# Patient Record
Sex: Female | Born: 1960 | Race: Black or African American | Hispanic: No | Smoking: Never smoker
Health system: Southern US, Community
[De-identification: ages and names within clinical notes are randomized; demographics above are authoritative.]

## PROBLEM LIST (undated history)

## (undated) DIAGNOSIS — I10 Essential (primary) hypertension: Secondary | ICD-10-CM

## (undated) DIAGNOSIS — G43909 Migraine, unspecified, not intractable, without status migrainosus: Secondary | ICD-10-CM

## (undated) HISTORY — PX: ABDOMINAL HYSTERECTOMY: SHX81

## (undated) HISTORY — PX: FRACTURE SURGERY: SHX138

## (undated) HISTORY — PX: CHOLECYSTECTOMY: SHX55

---

## 1998-09-18 ENCOUNTER — Ambulatory Visit (HOSPITAL_COMMUNITY): Admission: RE | Admit: 1998-09-18 | Discharge: 1998-09-18 | Payer: Self-pay | Admitting: *Deleted

## 1998-09-18 ENCOUNTER — Encounter: Payer: Self-pay | Admitting: *Deleted

## 1998-10-12 ENCOUNTER — Encounter: Payer: Self-pay | Admitting: Emergency Medicine

## 1998-10-12 ENCOUNTER — Emergency Department (HOSPITAL_COMMUNITY): Admission: EM | Admit: 1998-10-12 | Discharge: 1998-10-12 | Payer: Self-pay | Admitting: Emergency Medicine

## 2014-03-04 DIAGNOSIS — I10 Essential (primary) hypertension: Secondary | ICD-10-CM | POA: Insufficient documentation

## 2014-12-18 DIAGNOSIS — Z8619 Personal history of other infectious and parasitic diseases: Secondary | ICD-10-CM | POA: Insufficient documentation

## 2014-12-18 DIAGNOSIS — Z8711 Personal history of peptic ulcer disease: Secondary | ICD-10-CM | POA: Insufficient documentation

## 2014-12-18 DIAGNOSIS — K219 Gastro-esophageal reflux disease without esophagitis: Secondary | ICD-10-CM | POA: Insufficient documentation

## 2015-02-02 DIAGNOSIS — M7741 Metatarsalgia, right foot: Secondary | ICD-10-CM | POA: Insufficient documentation

## 2016-02-10 DIAGNOSIS — M47812 Spondylosis without myelopathy or radiculopathy, cervical region: Secondary | ICD-10-CM | POA: Insufficient documentation

## 2017-01-16 DIAGNOSIS — M25522 Pain in left elbow: Secondary | ICD-10-CM | POA: Insufficient documentation

## 2017-01-24 ENCOUNTER — Encounter: Payer: Self-pay | Admitting: Physical Therapy

## 2018-02-12 ENCOUNTER — Other Ambulatory Visit: Payer: Self-pay

## 2018-02-12 ENCOUNTER — Encounter: Payer: Self-pay | Admitting: *Deleted

## 2018-02-12 ENCOUNTER — Ambulatory Visit
Admission: EM | Admit: 2018-02-12 | Discharge: 2018-02-12 | Disposition: A | Discharge status: Home / Self Care | Payer: Worker's Compensation | Attending: Family Medicine | Admitting: Family Medicine

## 2018-02-12 ENCOUNTER — Ambulatory Visit (INDEPENDENT_AMBULATORY_CARE_PROVIDER_SITE_OTHER): Payer: Worker's Compensation

## 2018-02-12 DIAGNOSIS — W19XXXA Unspecified fall, initial encounter: Secondary | ICD-10-CM | POA: Diagnosis not present

## 2018-02-12 DIAGNOSIS — M25552 Pain in left hip: Secondary | ICD-10-CM | POA: Diagnosis not present

## 2018-02-12 DIAGNOSIS — R2232 Localized swelling, mass and lump, left upper limb: Secondary | ICD-10-CM | POA: Diagnosis not present

## 2018-02-12 DIAGNOSIS — M25522 Pain in left elbow: Secondary | ICD-10-CM

## 2018-02-12 DIAGNOSIS — M791 Myalgia, unspecified site: Secondary | ICD-10-CM | POA: Diagnosis not present

## 2018-02-12 HISTORY — DX: Essential (primary) hypertension: I10
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
## 2018-02-12 MED ORDER — TRAMADOL HCL 50 MG PO TABS: 50.0000 mg | ORAL_TABLET | Freq: Three times a day (TID) | ORAL | 0 refills | Status: DC | PRN

## 2018-02-12 NOTE — Discharge Instructions (Signed)
Rest.  Pain medication as needed.  Take care  Dr. Patterson Hollenbaugh  
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
## 2018-09-21 ENCOUNTER — Ambulatory Visit
Admission: EM | Admit: 2018-09-21 | Discharge: 2018-09-21 | Disposition: A | Discharge status: Home / Self Care | Payer: Managed Care, Other (non HMO) | Attending: Family Medicine | Admitting: Family Medicine

## 2018-09-21 ENCOUNTER — Encounter: Payer: Self-pay | Admitting: Emergency Medicine

## 2018-09-21 ENCOUNTER — Other Ambulatory Visit: Payer: Self-pay

## 2018-09-21 DIAGNOSIS — S70362A Insect bite (nonvenomous), left thigh, initial encounter: Secondary | ICD-10-CM | POA: Diagnosis not present

## 2018-09-21 DIAGNOSIS — W57XXXA Bitten or stung by nonvenomous insect and other nonvenomous arthropods, initial encounter: Secondary | ICD-10-CM

## 2018-09-21 DIAGNOSIS — S80862A Insect bite (nonvenomous), left lower leg, initial encounter: Secondary | ICD-10-CM

## 2018-09-21 HISTORY — DX: Migraine, unspecified, not intractable, without status migrainosus: G43.909

## 2018-09-21 MED ORDER — DOXYCYCLINE HYCLATE 100 MG PO CAPS: 100.0000 mg | ORAL_CAPSULE | Freq: Two times a day (BID) | ORAL | 0 refills | Status: DC

## 2018-09-21 MED ORDER — MUPIROCIN 2 % EX OINT: TOPICAL_OINTMENT | CUTANEOUS | 0 refills | Status: AC

## 2018-09-21 MED ORDER — TRIAMCINOLONE ACETONIDE 0.1 % EX CREA: 1.0000 "application " | TOPICAL_CREAM | Freq: Two times a day (BID) | CUTANEOUS | 0 refills | Status: DC
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
## 2018-09-21 NOTE — ED Triage Notes (Signed)
Patient in today c/o insect bites on her left thigh x 1 day. Patient states the bite is itchy and painful. 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

## 2018-09-21 NOTE — Discharge Instructions (Addendum)
Use medication as prescribed. Cool compresses. Avoid scratching.   As discussed continue to monitor, and if redness increases in size or ear tenderness then start oral antibiotic.  Reevaluation for any concerns.  Follow up with your primary care physician this week as needed. Return to Urgent care for new or worsening concerns.

## 2019-02-04 DIAGNOSIS — M5442 Lumbago with sciatica, left side: Secondary | ICD-10-CM | POA: Insufficient documentation

## 2019-04-30 DIAGNOSIS — M5412 Radiculopathy, cervical region: Secondary | ICD-10-CM | POA: Insufficient documentation

## 2020-04-14 DIAGNOSIS — IMO0002 Reserved for concepts with insufficient information to code with codable children: Secondary | ICD-10-CM | POA: Insufficient documentation

## 2021-03-04 DIAGNOSIS — M26609 Unspecified temporomandibular joint disorder, unspecified side: Secondary | ICD-10-CM | POA: Insufficient documentation

## 2021-06-07 DIAGNOSIS — M7918 Myalgia, other site: Secondary | ICD-10-CM | POA: Insufficient documentation

## 2021-12-07 ENCOUNTER — Emergency Department: Payer: PRIVATE HEALTH INSURANCE

## 2021-12-07 ENCOUNTER — Encounter: Payer: Self-pay | Admitting: Emergency Medicine

## 2021-12-07 ENCOUNTER — Other Ambulatory Visit: Payer: Self-pay

## 2021-12-07 DIAGNOSIS — I10 Essential (primary) hypertension: Secondary | ICD-10-CM | POA: Diagnosis not present

## 2021-12-07 DIAGNOSIS — S66911A Strain of unspecified muscle, fascia and tendon at wrist and hand level, right hand, initial encounter: Secondary | ICD-10-CM | POA: Insufficient documentation

## 2021-12-07 DIAGNOSIS — Y990 Civilian activity done for income or pay: Secondary | ICD-10-CM | POA: Diagnosis not present

## 2021-12-07 DIAGNOSIS — X500XXA Overexertion from strenuous movement or load, initial encounter: Secondary | ICD-10-CM | POA: Diagnosis not present

## 2021-12-07 DIAGNOSIS — S6991XA Unspecified injury of right wrist, hand and finger(s), initial encounter: Secondary | ICD-10-CM | POA: Diagnosis present

## 2021-12-07 NOTE — ED Triage Notes (Signed)
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

## 2021-12-08 ENCOUNTER — Emergency Department
Admission: EM | Admit: 2021-12-08 | Discharge: 2021-12-08 | Disposition: A | Discharge status: Home / Self Care | Payer: PRIVATE HEALTH INSURANCE | Attending: Emergency Medicine | Admitting: Emergency Medicine

## 2021-12-08 DIAGNOSIS — S66911A Strain of unspecified muscle, fascia and tendon at wrist and hand level, right hand, initial encounter: Secondary | ICD-10-CM

## 2021-12-08 NOTE — ED Provider Notes (Signed)
[facility] Provider Note    Event Date/Time   First MD Initiated Contact with Patient 12/08/21 0008     (approximate)   History   Chief Complaint Hand Injury   HPI  Valerie Hopkins is a 61 y.o. female with past medical history of hypertension and migraines who presents to the ED complaining of hand injury.  Patient reports that hours prior to arrival she was helping to lift a patient when she felt a "pop" in her right hand.  She complains of pain in the ulnar portion of her right hand, near her fourth and fifth metacarpals.  Pain is described as sharp and exacerbated when she tries to move the hand.  Pain became more severe when she was attempting to type on the computer, eventually prompting her to seek care in the ED.  She denies any prior injuries to the right hand, denies injuries to her other extremities today.     Physical Exam   Triage Vital Signs: ED Triage Vitals  Enc Vitals Group     BP 12/07/21 2305 (!) 175/95     Pulse Rate 12/07/21 2305 69     Resp 12/07/21 2305 20     Temp 12/07/21 2305 99 F (37.2 C)     Temp Source 12/07/21 2305 Oral     SpO2 12/07/21 2305 100 %     Weight 12/07/21 2306 174 lb (78.9 kg)     Height 12/07/21 2306 5\' 2"  (1.575 m)     Head Circumference --      Peak Flow --      Pain Score 12/07/21 2306 9     Pain Loc --      Pain Edu? --      Excl. in GC? --     Most recent vital signs: Vitals:   12/07/21 2305  BP: (!) 175/95  Pulse: 69  Resp: 20  Temp: 99 F (37.2 C)  SpO2: 100%    Constitutional: Alert and oriented. Eyes: Conjunctivae are normal. Head: Atraumatic. Nose: No congestion/rhinnorhea. Mouth/Throat: Mucous membranes are moist.  Cardiovascular: Normal rate, regular rhythm. Grossly normal heart sounds.  2+ radial pulses bilaterally. Respiratory: Normal respiratory effort.  No retractions. Lungs CTAB. Gastrointestinal: Soft and nontender. No distention. Musculoskeletal: No lower extremity  tenderness nor edema.  Tenderness to palpation over right fourth and fifth metacarpals with no obvious deformity.  No tenderness to palpation at right wrist, range of motion intact. Neurologic:  Normal speech and language. No gross focal neurologic deficits are appreciated.    ED Results / Procedures / Treatments   Labs (all labs ordered are listed, but only abnormal results are displayed) Labs Reviewed - No data to display  RADIOLOGY X-rays of right hand reviewed by me with no fracture or dislocation.  PROCEDURES:  Critical Care performed: No  Procedures   MEDICATIONS ORDERED IN ED: Medications - No data to display   IMPRESSION / MDM / ASSESSMENT AND PLAN / ED COURSE  I reviewed the triage vital signs and the nursing notes.                              61 y.o. 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  Differential diagnosis includes, but is not limited to, fracture, dislocation, hand strain.  Patient well-appearing and in no  acute distress, no obvious deformities noted to right hand and she is neurovascularly intact distally.  X-rays show no evidence of fracture, dislocation, or other acute injury.  Suspect strain of her right hand and wrist and patient is appropriate for outpatient management.  She was counseled to alternate Tylenol and ibuprofen, follow-up with her PCP.  She was counseled to return to the ED for new worsening symptoms, patient present plan.        FINAL CLINICAL IMPRESSION(S) / ED DIAGNOSES   Final diagnoses:  Strain of right hand, initial encounter     Rx / DC Orders   ED Discharge Orders     None        Note:  This document was prepared using Dragon voice recognition software and may include unintentional dictation errors.   Chesley Noon, MD 12/08/21 [phone]

## 2021-12-10 ENCOUNTER — Encounter: Payer: Self-pay | Admitting: Emergency Medicine

## 2021-12-10 ENCOUNTER — Emergency Department: Payer: No Typology Code available for payment source

## 2021-12-10 ENCOUNTER — Emergency Department
Admission: EM | Admit: 2021-12-10 | Discharge: 2021-12-10 | Disposition: A | Discharge status: Home / Self Care | Payer: No Typology Code available for payment source | Attending: Emergency Medicine | Admitting: Emergency Medicine

## 2021-12-10 ENCOUNTER — Other Ambulatory Visit: Payer: Self-pay

## 2021-12-10 DIAGNOSIS — W19XXXA Unspecified fall, initial encounter: Secondary | ICD-10-CM

## 2021-12-10 DIAGNOSIS — W1789XA Other fall from one level to another, initial encounter: Secondary | ICD-10-CM | POA: Insufficient documentation

## 2021-12-10 DIAGNOSIS — M545 Low back pain, unspecified: Secondary | ICD-10-CM | POA: Insufficient documentation

## 2021-12-10 DIAGNOSIS — I10 Essential (primary) hypertension: Secondary | ICD-10-CM | POA: Insufficient documentation

## 2021-12-10 DIAGNOSIS — M25552 Pain in left hip: Secondary | ICD-10-CM | POA: Diagnosis not present

## 2021-12-10 DIAGNOSIS — R42 Dizziness and giddiness: Secondary | ICD-10-CM | POA: Diagnosis present

## 2021-12-10 LAB — CBC
HCT: 40.1 % (ref 36.0–46.0)
Hemoglobin: 12.2 g/dL (ref 12.0–15.0)
MCH: 28.4 pg (ref 26.0–34.0)
MCHC: 30.4 g/dL (ref 30.0–36.0)
MCV: 93.5 fL (ref 80.0–100.0)
Platelets: 248 10*3/uL (ref 150–400)
RBC: 4.29 MIL/uL (ref 3.87–5.11)
RDW: 12.6 % (ref 11.5–15.5)
WBC: 6 10*3/uL (ref 4.0–10.5)
nRBC: 0 % (ref 0.0–0.2)

## 2021-12-10 LAB — URINALYSIS, ROUTINE W REFLEX MICROSCOPIC
Bilirubin Urine: NEGATIVE
Glucose, UA: NEGATIVE mg/dL
Hgb urine dipstick: NEGATIVE
Ketones, ur: NEGATIVE mg/dL
Leukocytes,Ua: NEGATIVE
Nitrite: NEGATIVE
Protein, ur: NEGATIVE mg/dL
Specific Gravity, Urine: 1.005 (ref 1.005–1.030)
pH: 6 (ref 5.0–8.0)

## 2021-12-10 LAB — BASIC METABOLIC PANEL
Anion gap: 6 (ref 5–15)
BUN: 13 mg/dL (ref 6–20)
CO2: 27 mmol/L (ref 22–32)
Calcium: 9.2 mg/dL (ref 8.9–10.3)
Chloride: 108 mmol/L (ref 98–111)
Creatinine, Ser: 0.67 mg/dL (ref 0.44–1.00)
GFR, Estimated: >60 mL/min (ref >60)
Glucose, Bld: 104 mg/dL — ABNORMAL HIGH (ref 70–99)
Potassium: 4.2 mmol/L (ref 3.5–5.1)
Sodium: 141 mmol/L (ref 135–145)

## 2021-12-10 IMAGING — CR DG LUMBAR SPINE COMPLETE 4+V
1 series · 5 of 5 positions shown · non-contrast
Comparison: None.

CLINICAL DATA: Pt does not remember what happened before she got
out of bed but states that she became dizzy and had a syncopal
episode. Pt c/o pain in her lower back and Left hip. Pain
generalized in lower back which radiates into left hip.Fall w left
sided hip and back pain

EXAM:
LUMBAR SPINE - COMPLETE 4+ VIEW

[Series 1: dg lumbar spine complete 4 +v · 0.14mm/px · 5 of 5 slices shown]
[im 1/5]
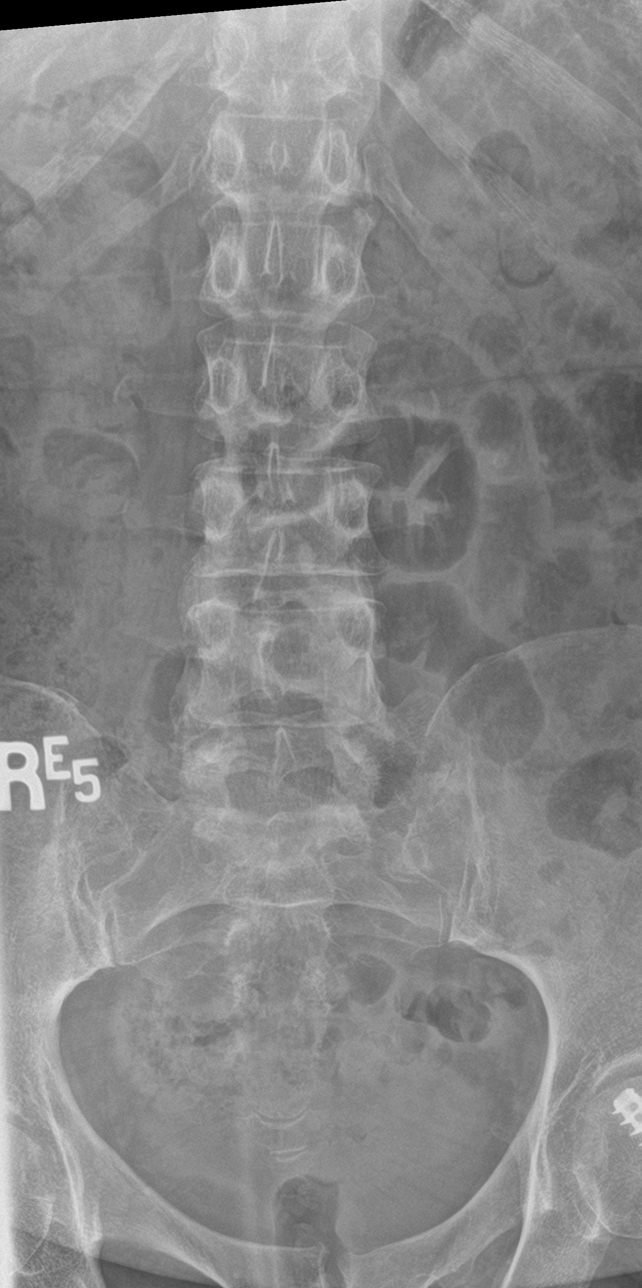
[im 2/5]
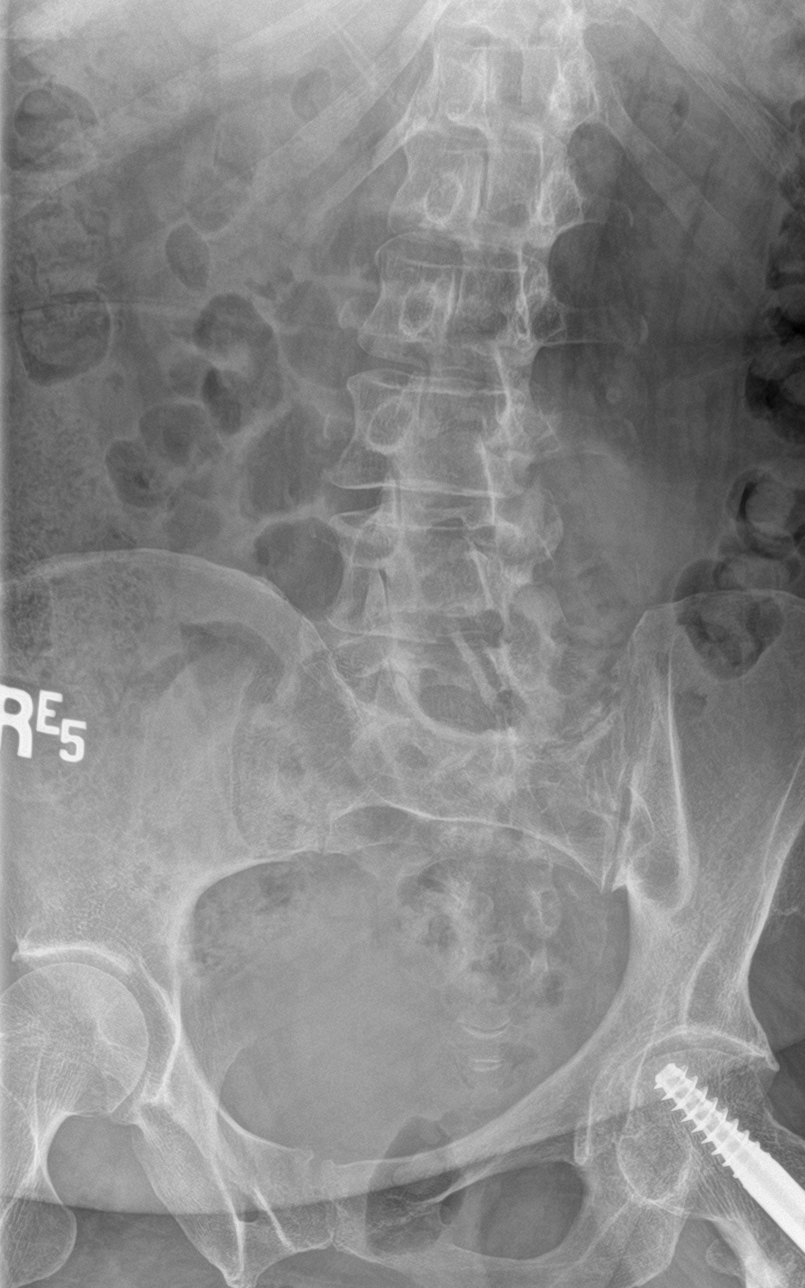
[im 3/5]
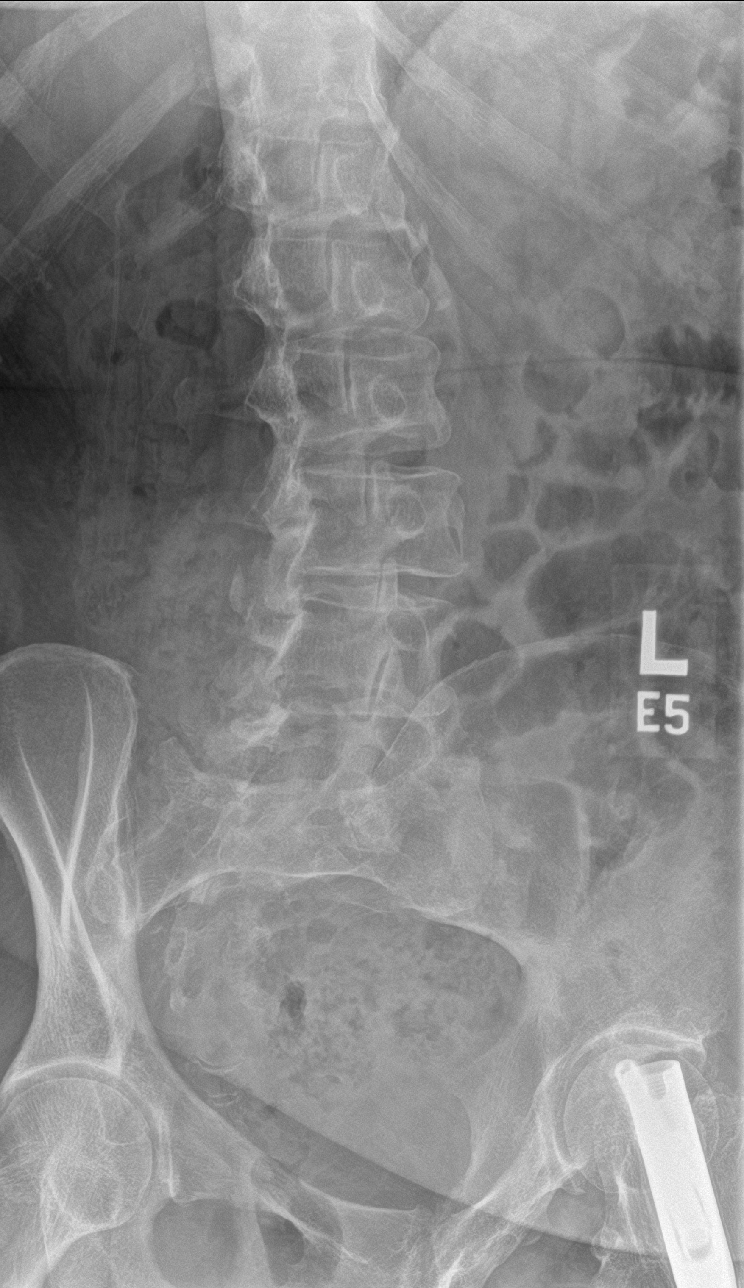
[im 4/5]
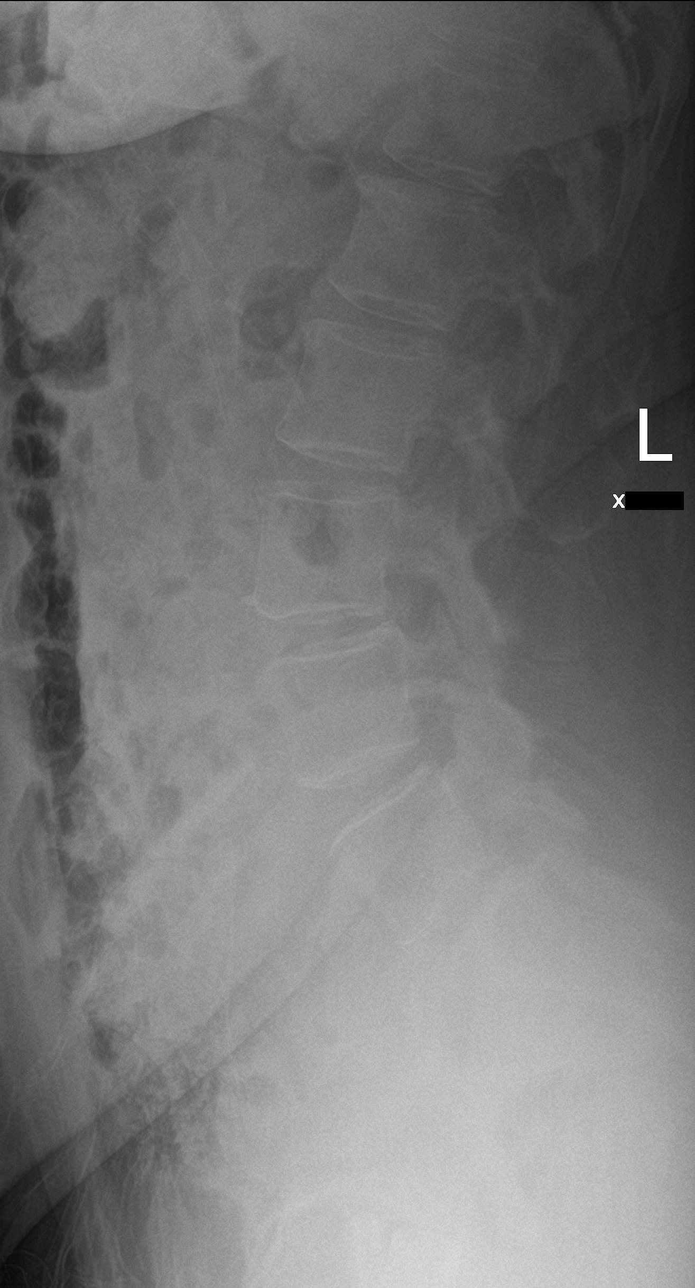
[im 5/5]
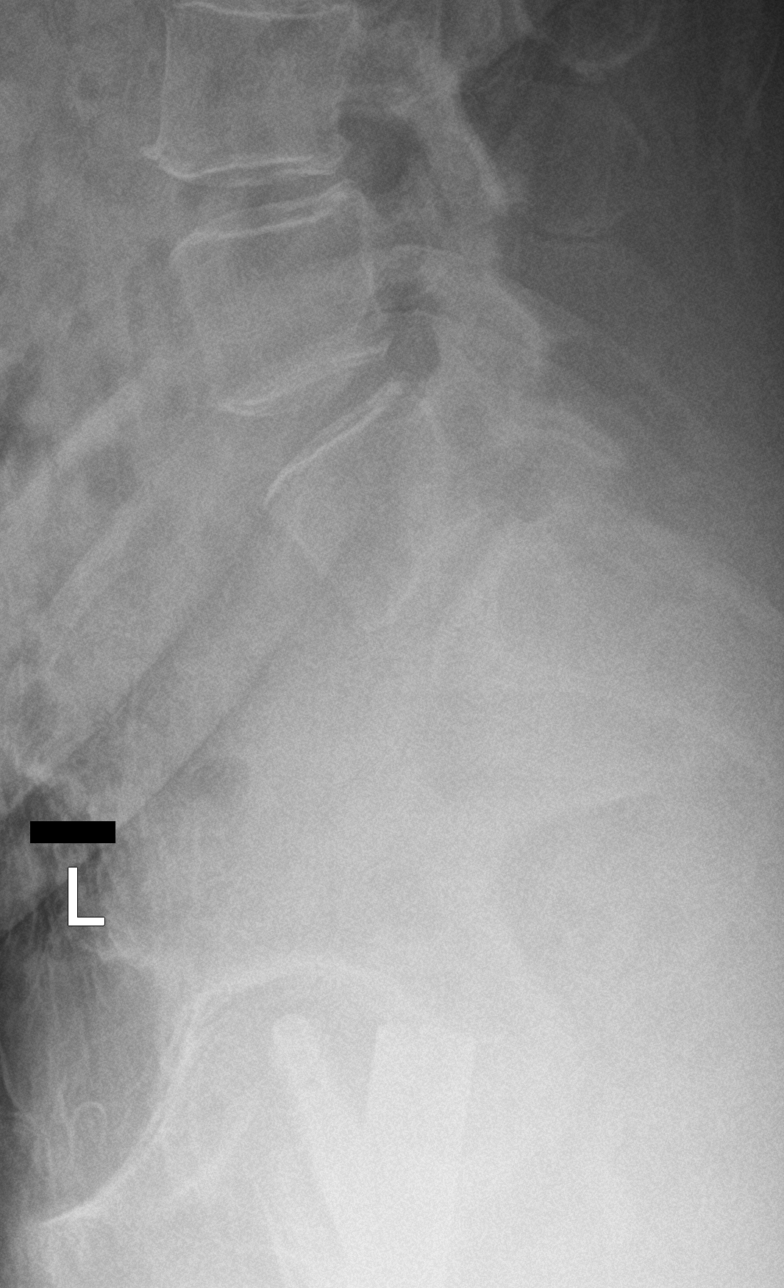

[5 of 5 positions shown; findings below may reference images not displayed]

FINDINGS: Normal alignment of lumbar vertebral bodies. No loss of vertebral
body height or disc height. No pars fracture. No subluxation. LEFT
hip fixation
IMPRESSION: Normal lumbar radiograph.

## 2021-12-10 IMAGING — CR DG HIP (WITH OR WITHOUT PELVIS) 2-3V*L*
1 series · 3 of 3 positions shown · non-contrast
Comparison: [DATE]

CLINICAL DATA: Left hip pain.

EXAM:
DG HIP (WITH OR WITHOUT PELVIS) 2-3V LEFT

[Series 1: dg hip unilat w or w/o pelvis 2-3 views  · non-contrast · 0.14mm/px · 3 of 3 slices shown]
[im 1/3]
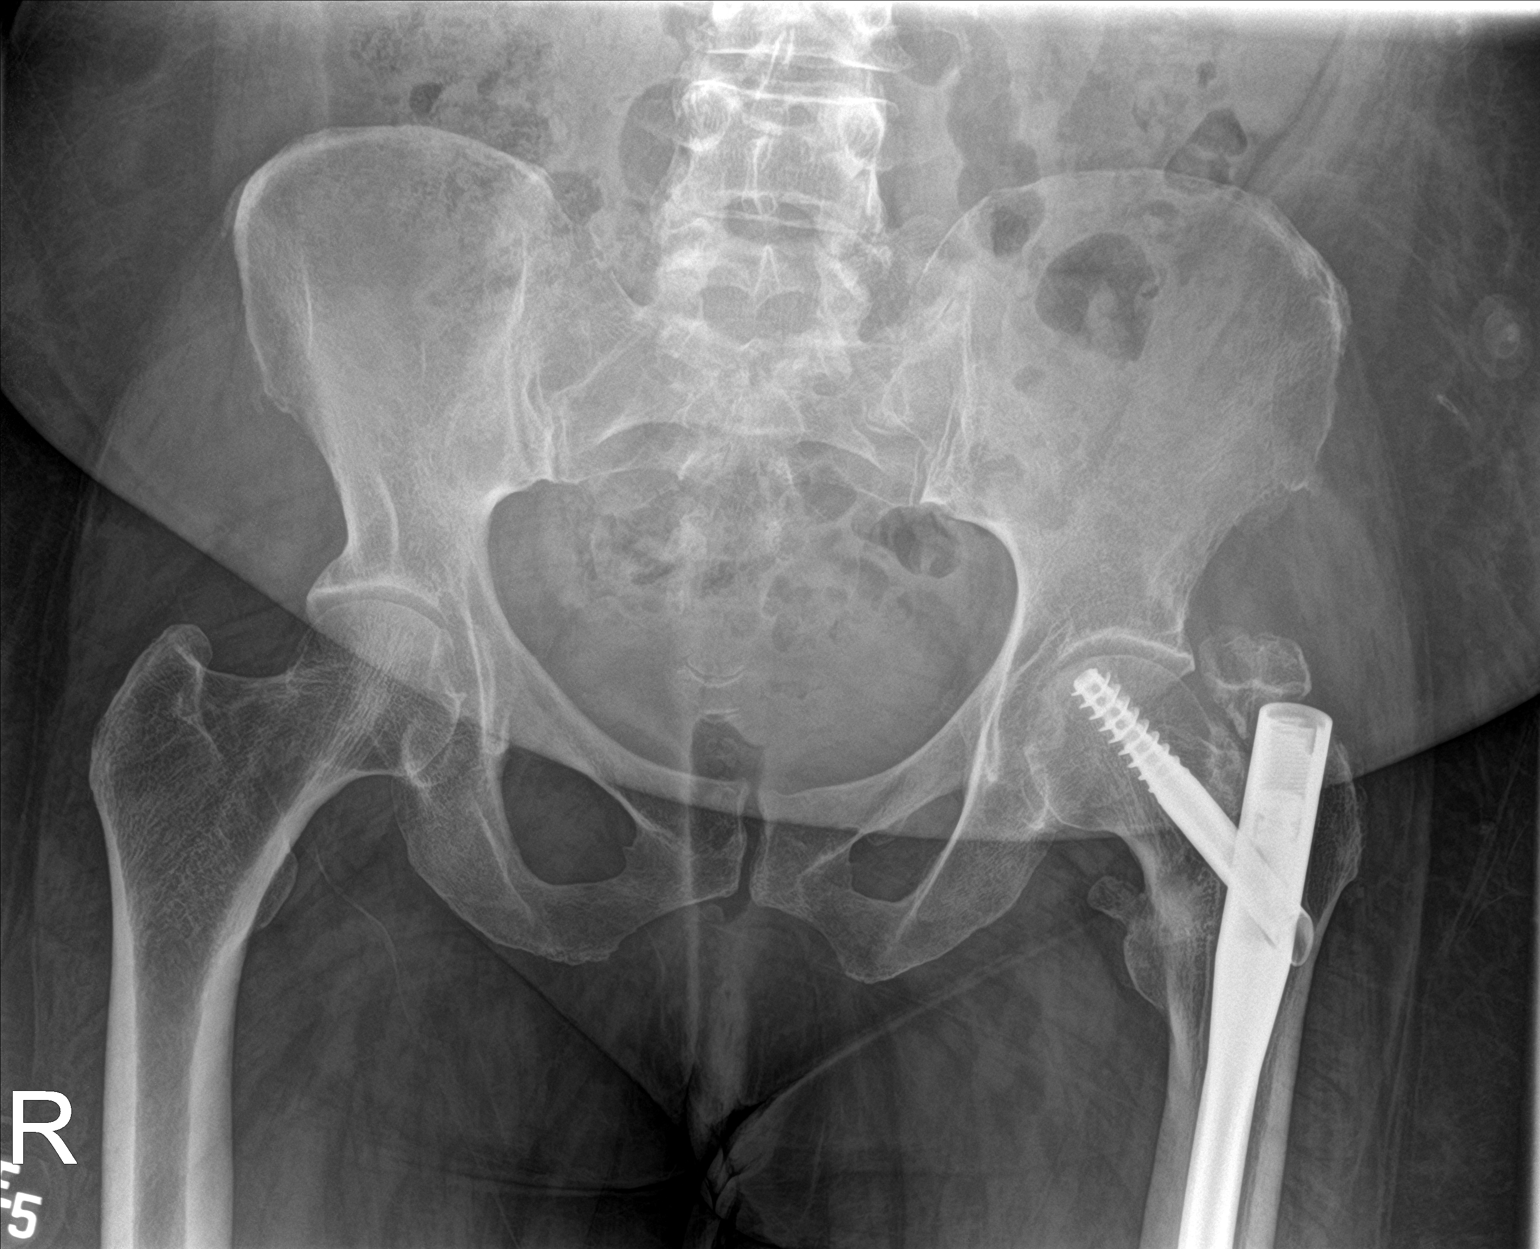
[im 2/3]
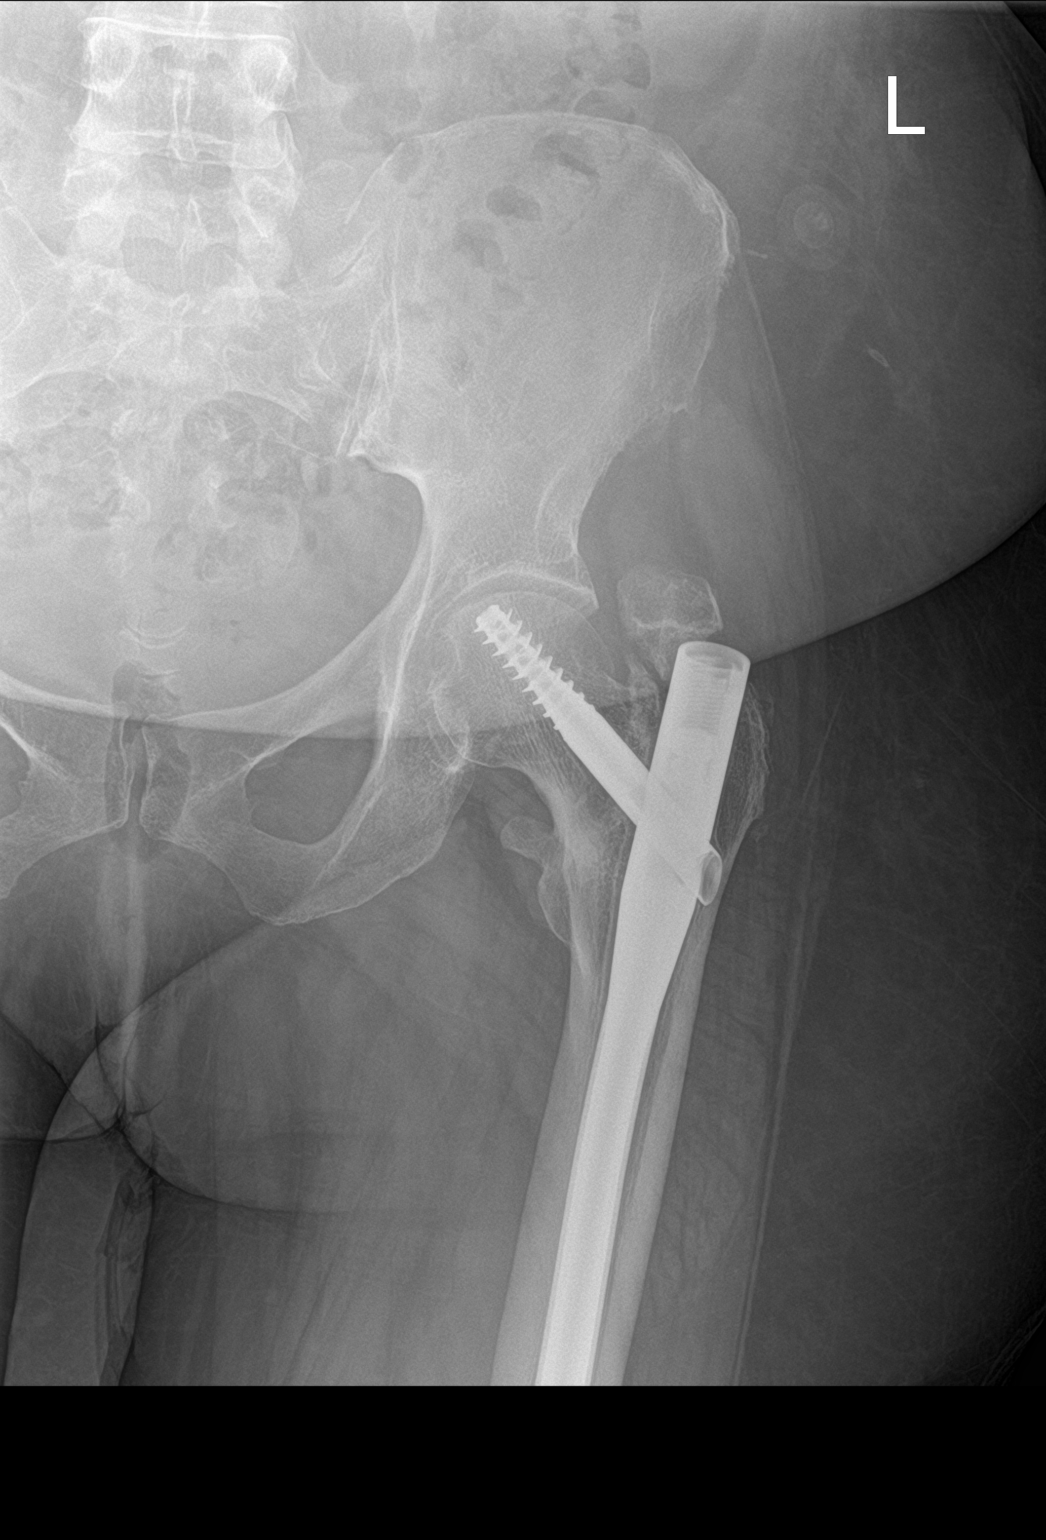
[im 3/3]
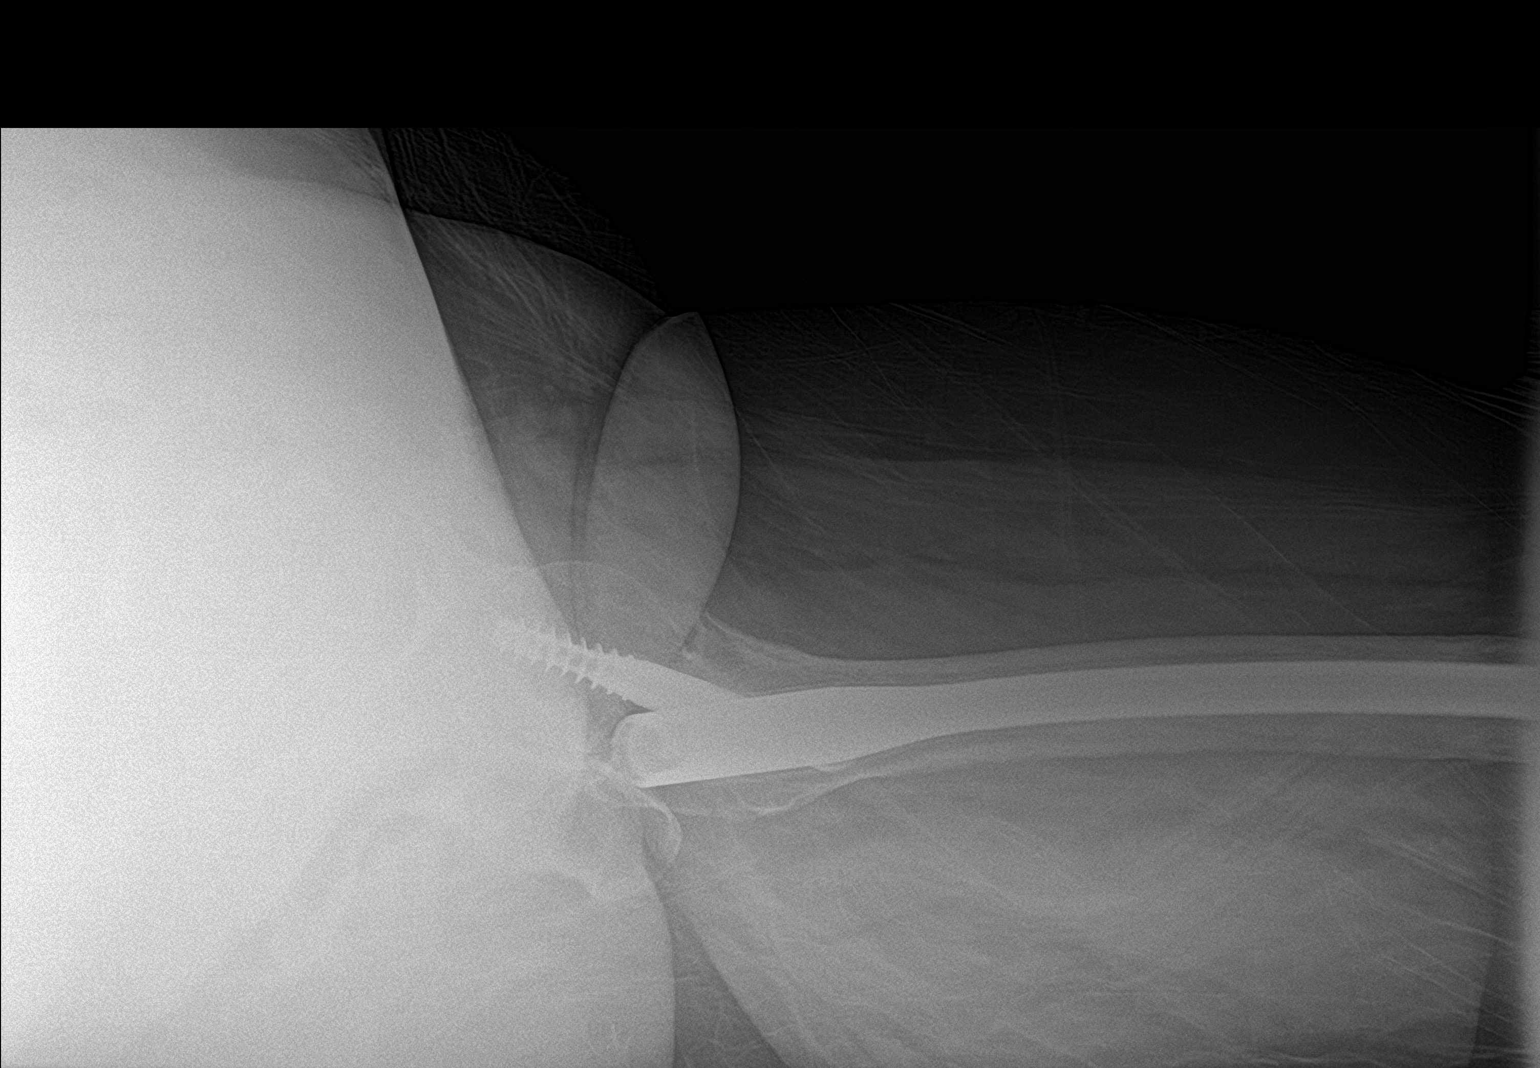

[3 of 3 positions shown; findings below may reference images not displayed]

FINDINGS: Bones are diffusely demineralized. Frontal pelvis shows no acute
fracture. Patient is status post ORIF for proximal left femur
fracture. Appearance is stable in the interval. The entire long
antegrade left femoral nail has not been included on this study.
IMPRESSION: 1. No acute bony abnormality with the limitation that the entire
left femoral IM nail has not been included on the exam. If there is
concern for left femur fracture, dedicated left femur films
recommended.

## 2021-12-10 IMAGING — CR DG FEMUR 2+V*L*
1 series · 4 of 4 positions shown · non-contrast
Comparison: X-ray dated [DATE]

CLINICAL DATA: Recent fall

EXAM:
LEFT FEMUR 2 VIEWS

[Series 1: dg femur min 2 views left · 0.14mm/px · 4 of 4 slices shown]
[im 1/4]
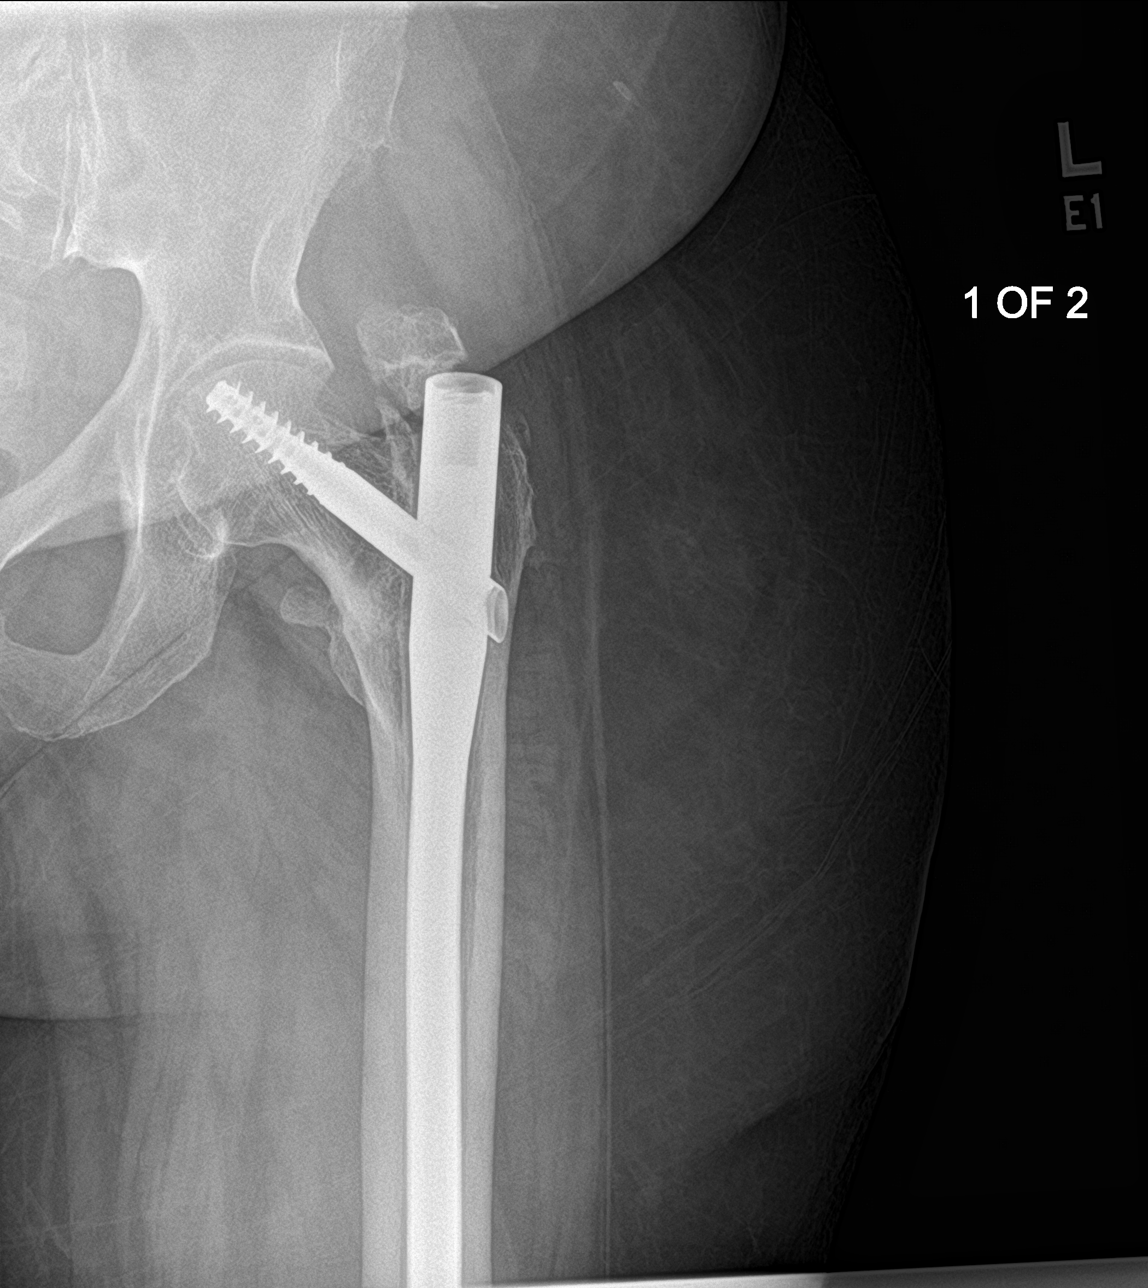
[im 2/4]
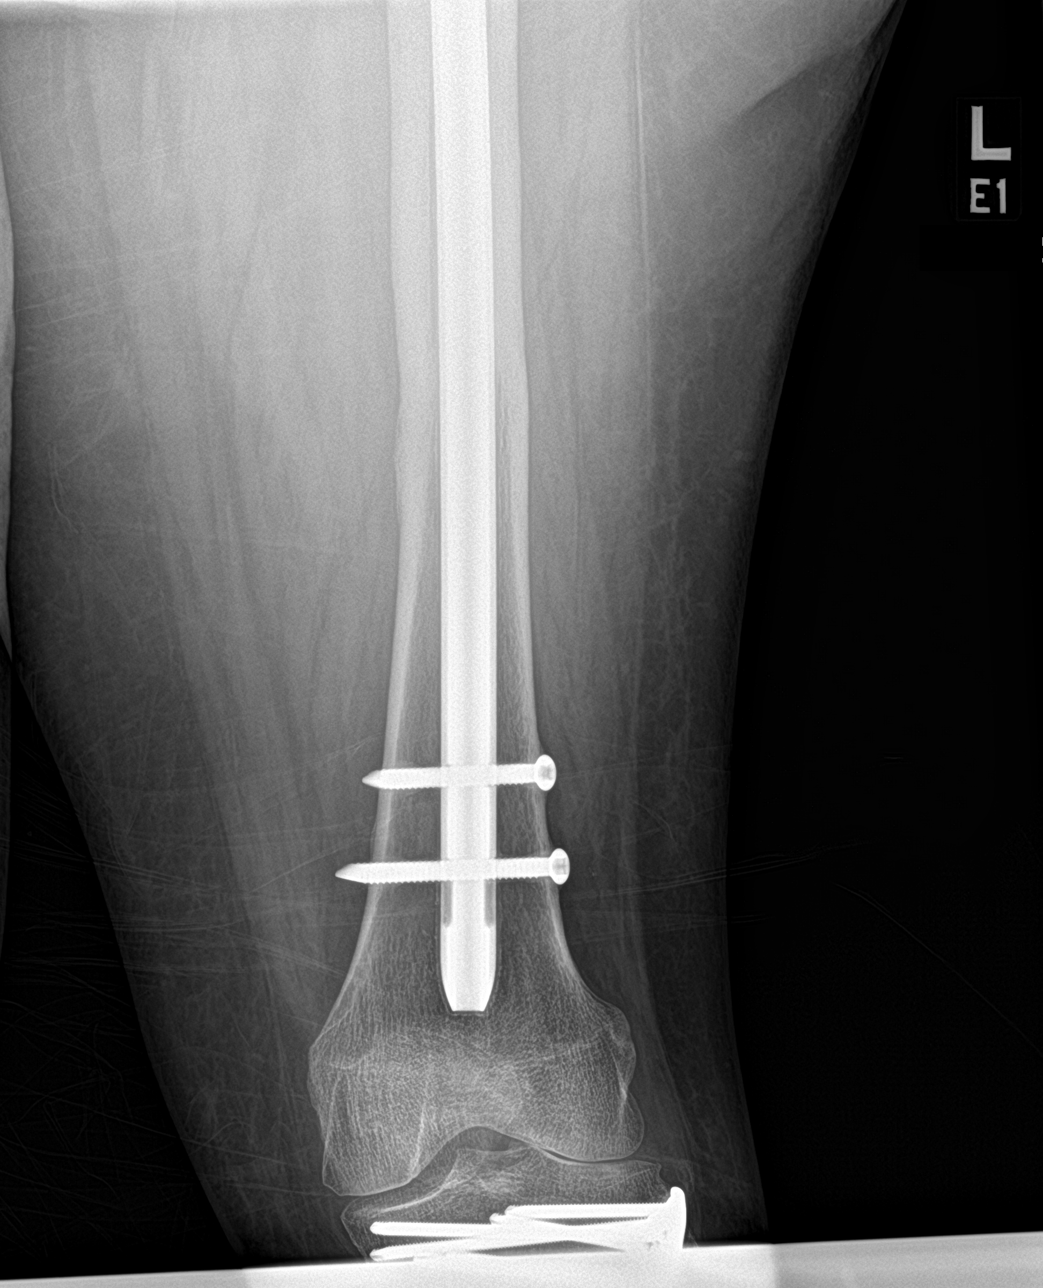
[im 3/4]
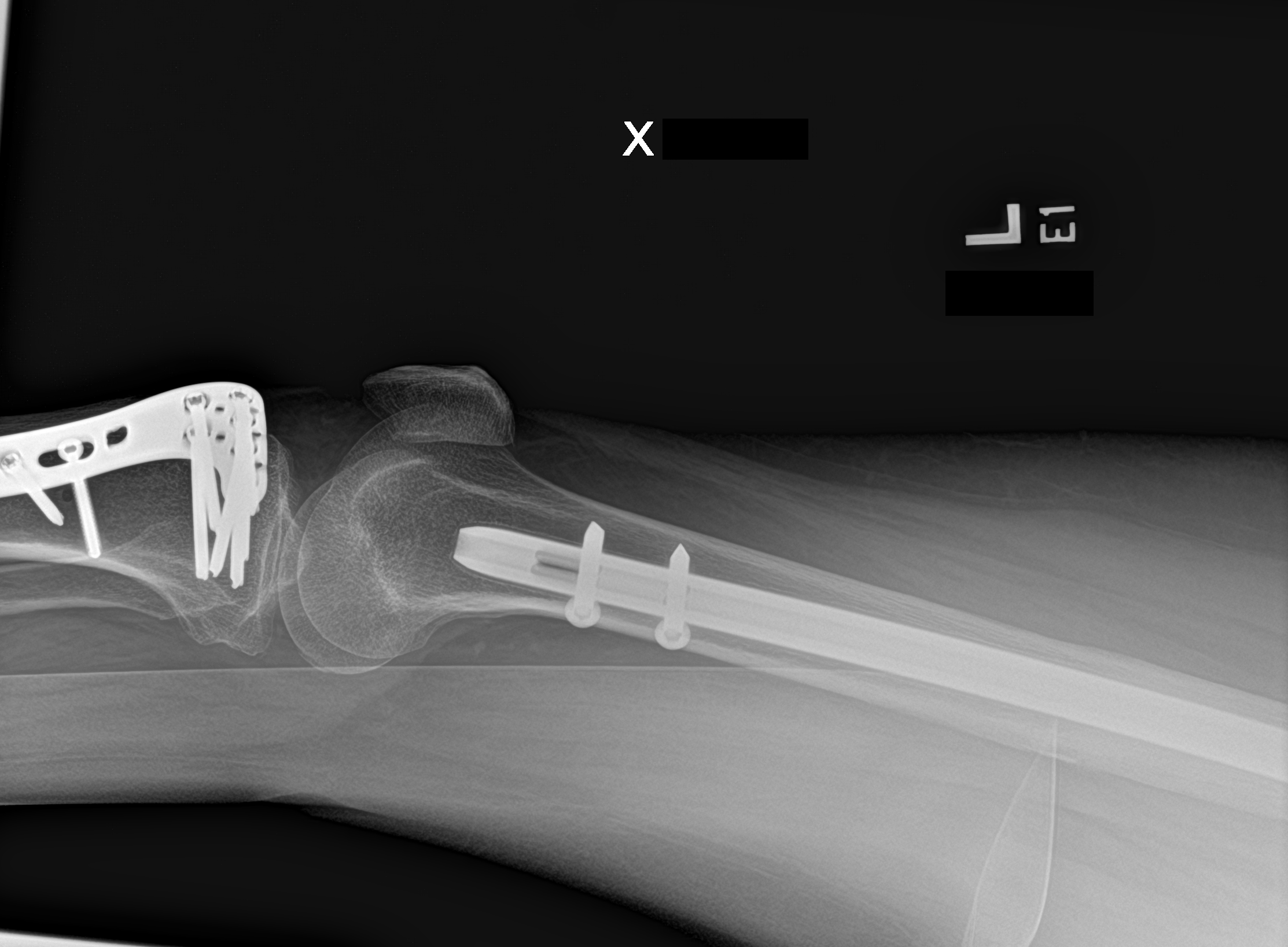
[im 4/4]
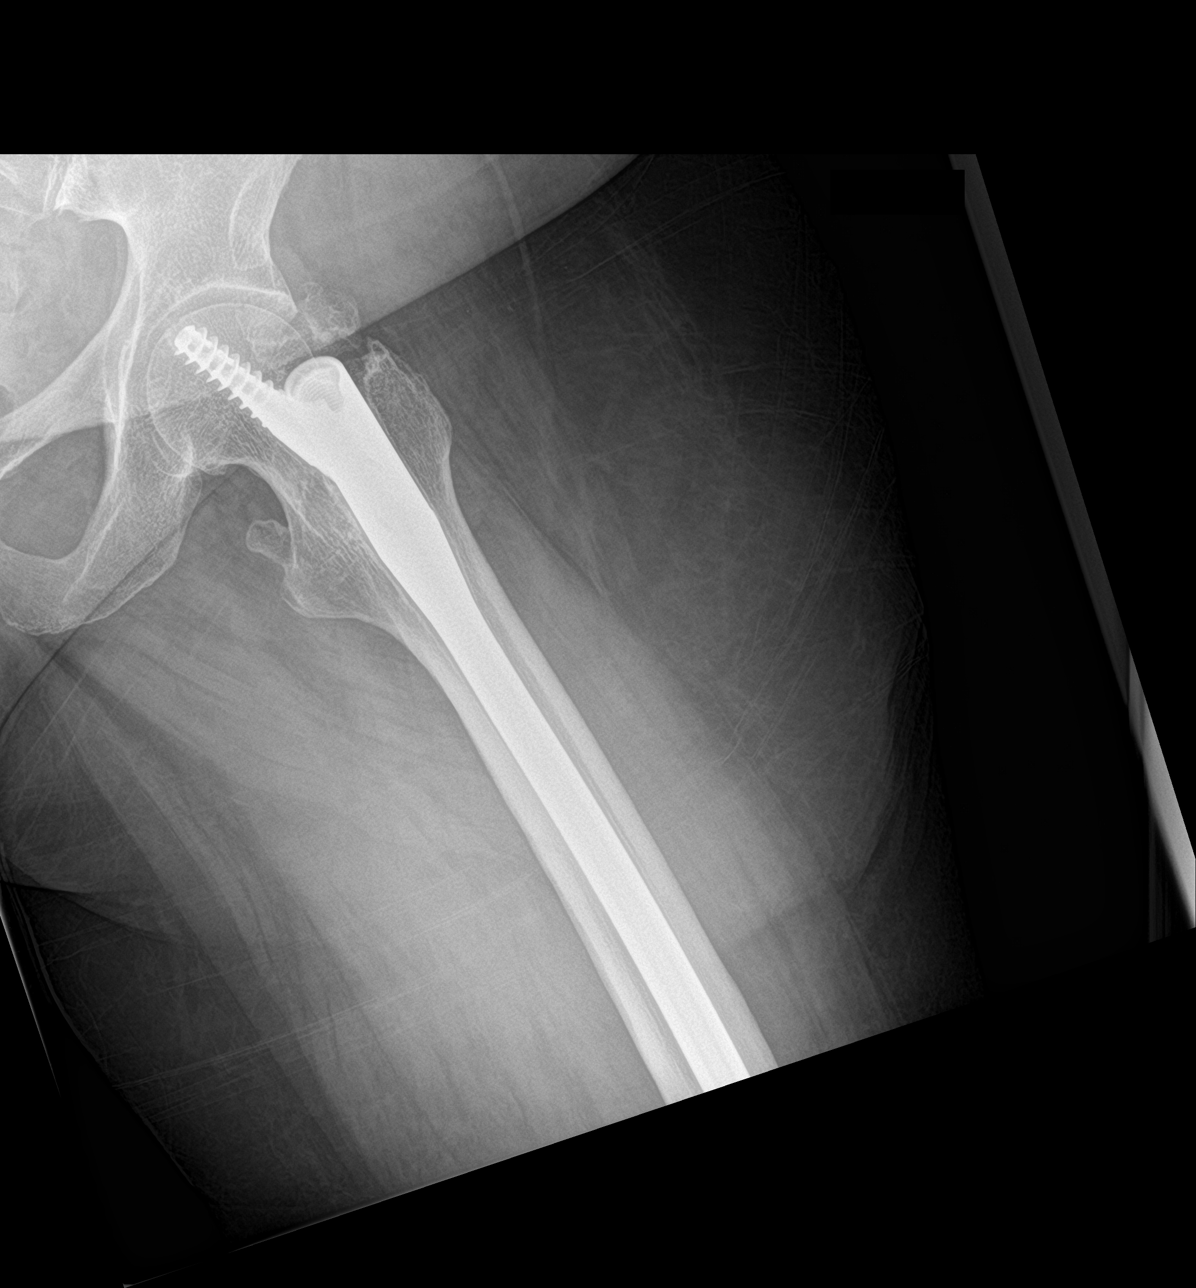

[4 of 4 positions shown; findings below may reference images not displayed]

FINDINGS: Prior intramedullary nail fixation of the left femur. No evidence of
acute fracture or dislocation. Soft tissues are unremarkable.
IMPRESSION: No acute osseous abnormality.

## 2021-12-10 IMAGING — CT CT HEAD W/O CM
4 series · 17 of 47 positions shown, 19 images · non-contrast
Comparison: None.

CLINICAL DATA: Fall with occipital trauma, dizzy



[Series 2: head wo · axial · 0.42mm/px · z∈[-136,-16]mm · 7 of 32 slices shown, 9 images]
[im 4/32  brain]
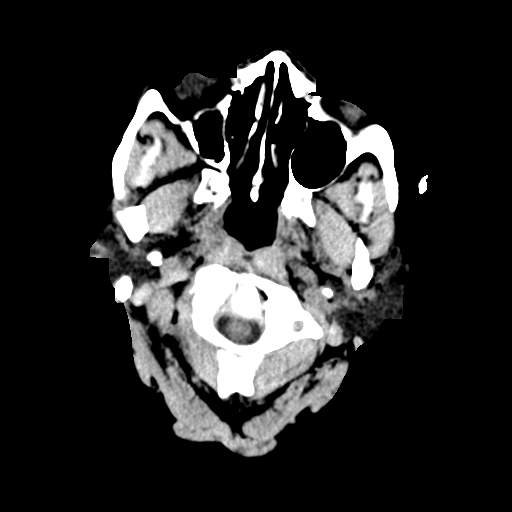
[im 4/32  bone]
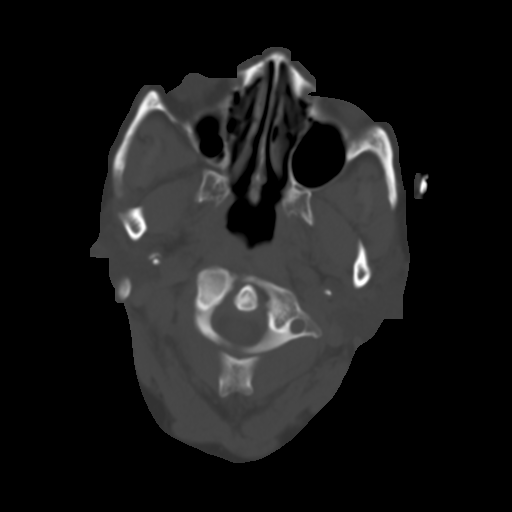
[im 8/32  brain]
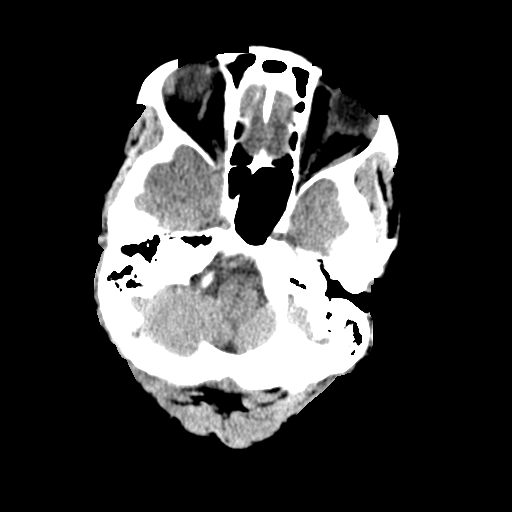
[im 12/32  brain]
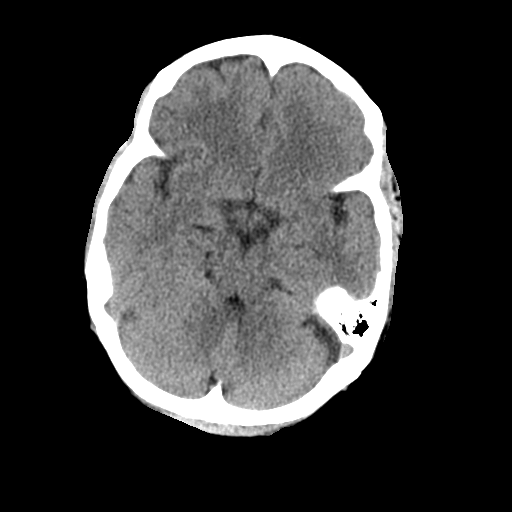
[im 16/32  brain]
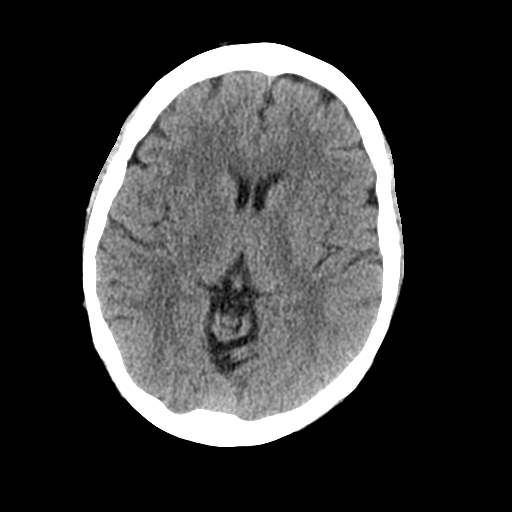
[im 20/32  brain]
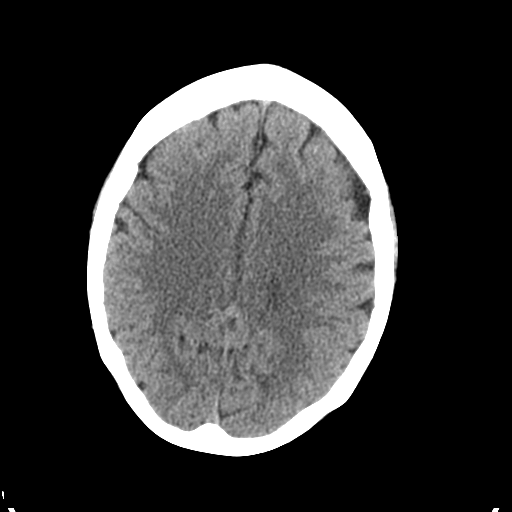
[im 20/32  bone]
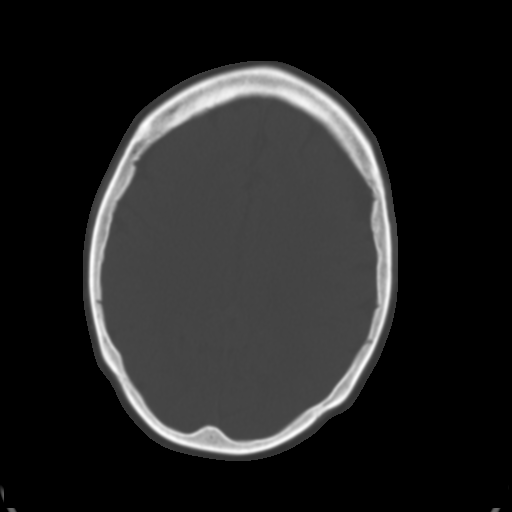
[im 24/32  brain]
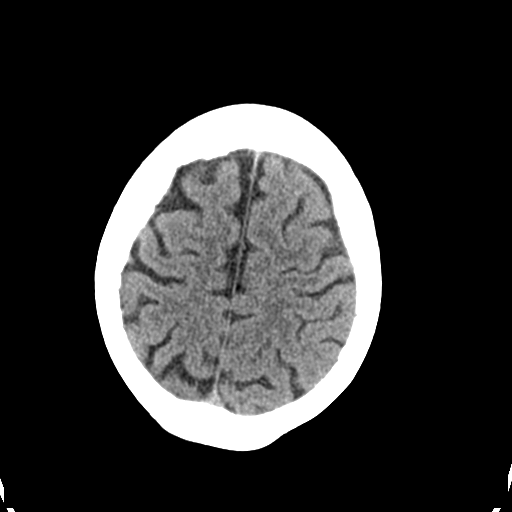
[im 28/32  brain]
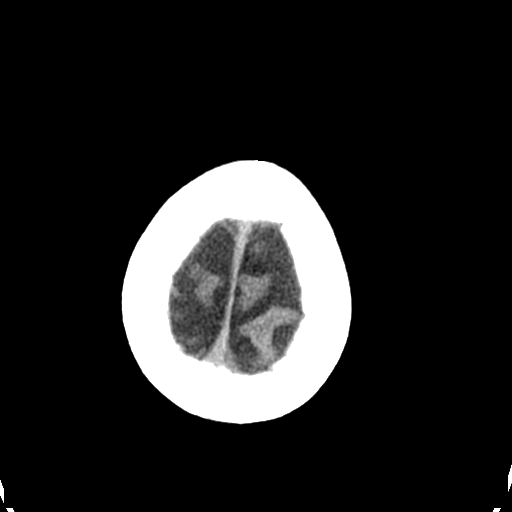

[Series 3: head bone · axial · 0.42mm/px · z∈[-137,-81]mm · 4 of 80 slices shown]
[im 8/80  bone]
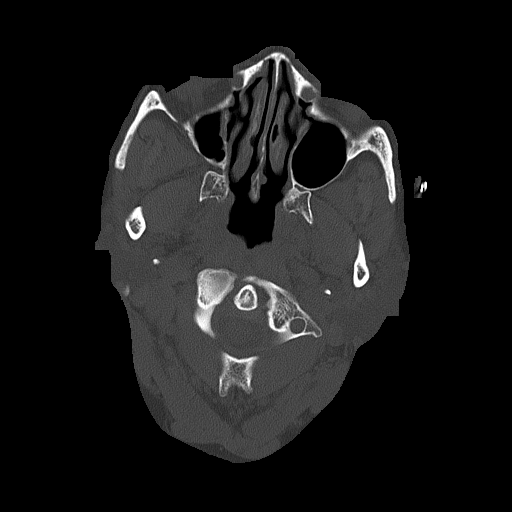
[im 16/80  bone]
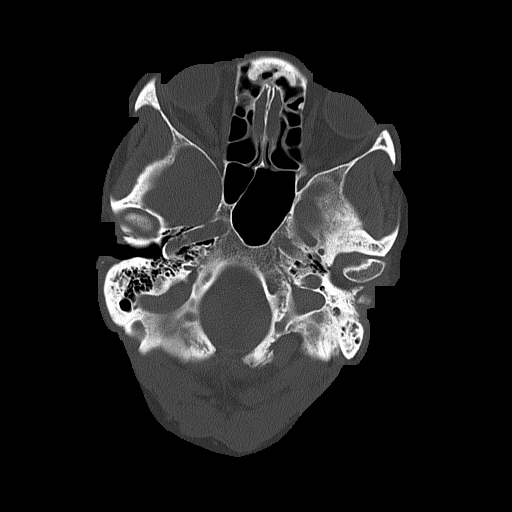
[im 24/80  bone]
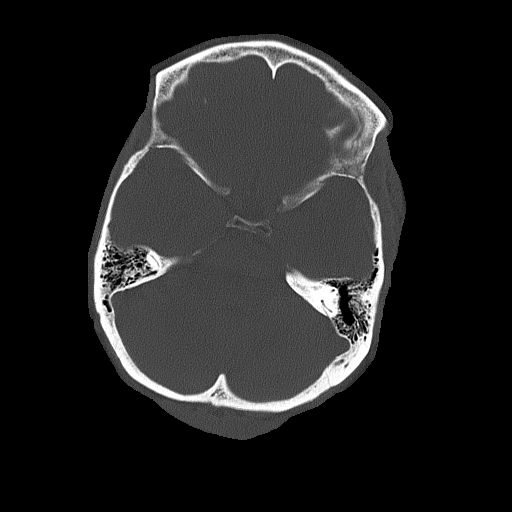
[im 36/80  bone]
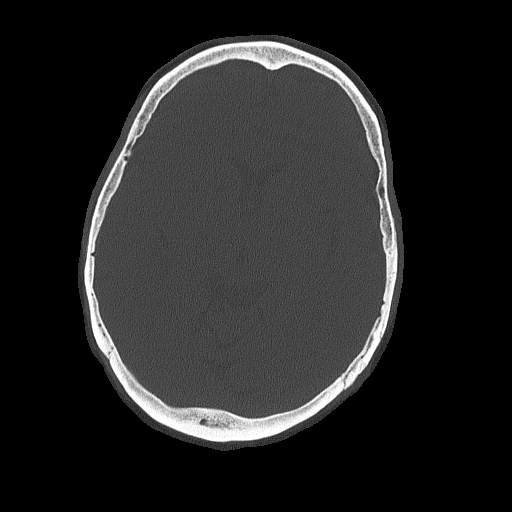

[Series 4: coronal soft tissue · coronal · 0.31mm/px · 3 of 64 slices shown]
[im 22/64  brain]
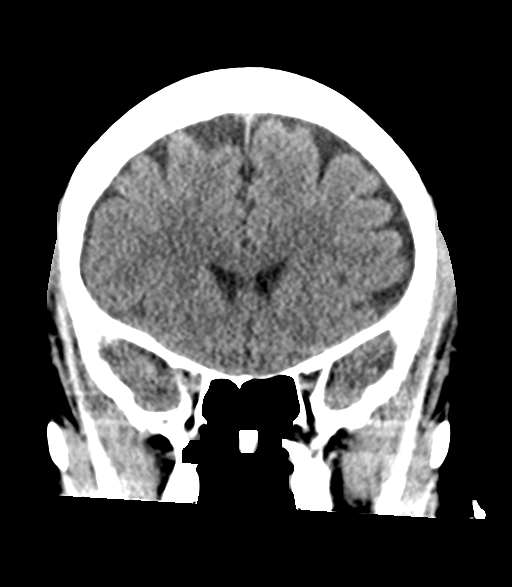
[im 29/64  brain]
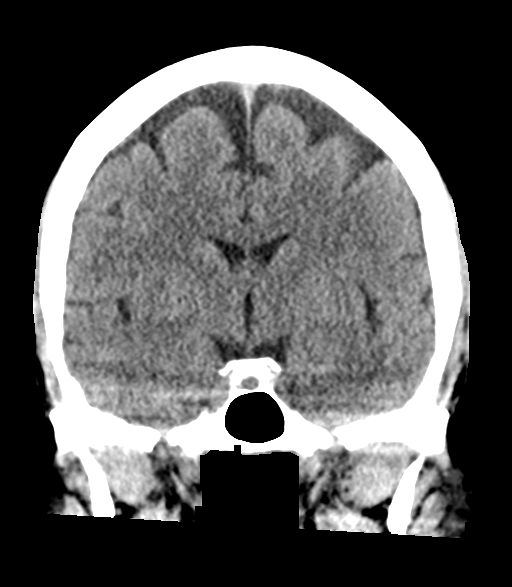
[im 36/64  brain]
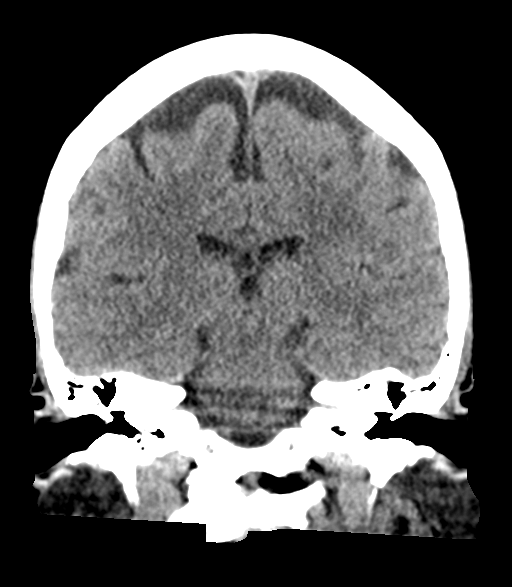

[Series 5: sagittal soft tissue · sagittal · 0.35mm/px · 3 of 52 slices shown]
[im 18/52  brain]
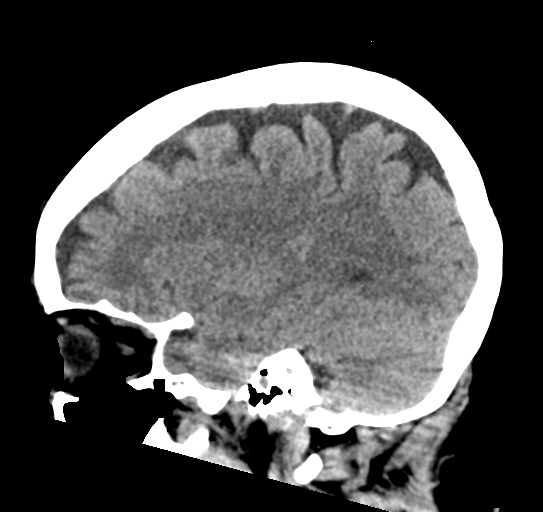
[im 26/52  brain]
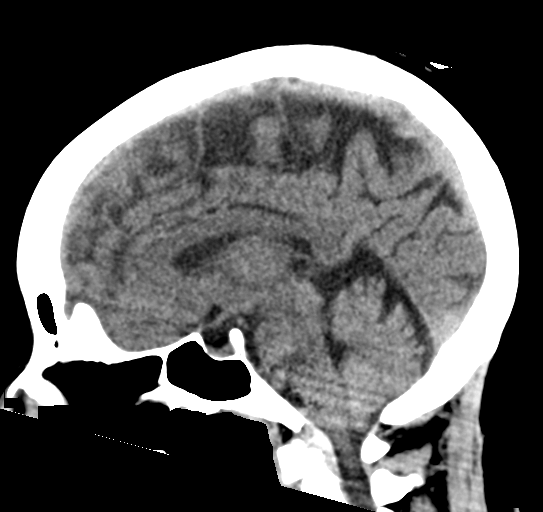
[im 35/52  brain]
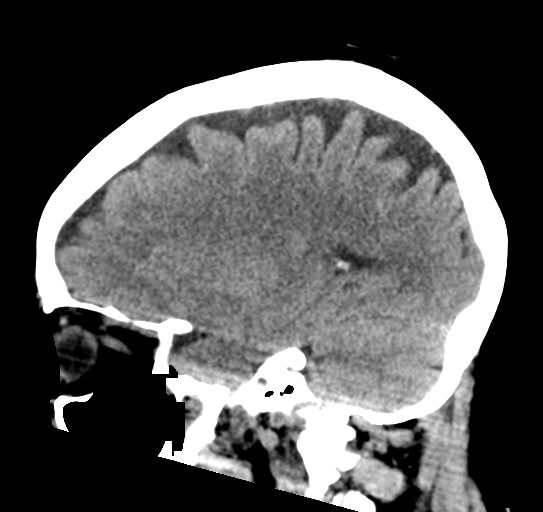

[17 of 47 positions shown; findings below may reference images not displayed]

FINDINGS: Brain: No evidence of acute infarction, hemorrhage, cerebral edema,
mass, mass effect, or midline shift. No hydrocephalus or extra-axial
fluid collection.

Vascular: No hyperdense vessel.

Skull: Normal. Negative for fracture or focal lesion.

Sinuses/Orbits: No acute finding.

Other: The mastoid air cells are well aerated.
IMPRESSION: IMPRESSION
No acute intracranial process.

## 2021-12-10 MED ORDER — LIDOCAINE 5 % EX PTCH
1.0000 | MEDICATED_PATCH | CUTANEOUS | Status: DC
  Administered 2021-12-10: 1 via TRANSDERMAL
  Filled 2021-12-10: qty 1

## 2021-12-10 MED ORDER — GABAPENTIN 400 MG PO CAPS
400.0000 mg | ORAL_CAPSULE | Freq: Once | ORAL | Status: AC
  Administered 2021-12-10: 400 mg via ORAL
  Filled 2021-12-10: qty 1

## 2021-12-10 MED ORDER — MECLIZINE HCL 25 MG PO TABS
25.0000 mg | ORAL_TABLET | Freq: Once | ORAL | Status: AC
  Administered 2021-12-10: 25 mg via ORAL
  Filled 2021-12-10: qty 1

## 2021-12-10 MED ORDER — LIDOCAINE 5 % EX PTCH: 1.0000 | MEDICATED_PATCH | Freq: Two times a day (BID) | CUTANEOUS | 1 refills | Status: DC

## 2021-12-10 MED ORDER — METHOCARBAMOL 500 MG PO TABS
500.0000 mg | ORAL_TABLET | Freq: Once | ORAL | Status: AC
  Administered 2021-12-10: 500 mg via ORAL
  Filled 2021-12-10: qty 1

## 2021-12-10 MED ORDER — ACETAMINOPHEN 500 MG PO TABS
1000.0000 mg | ORAL_TABLET | Freq: Once | ORAL | Status: AC
  Filled 2021-12-10: qty 2

## 2021-12-10 MED ORDER — OXYCODONE HCL 5 MG PO TABS
5.0000 mg | ORAL_TABLET | Freq: Once | ORAL | Status: AC
Start: 2021-12-10 — End: 2021-12-10
  Administered 2021-12-10: 5 mg via ORAL
  Filled 2021-12-10: qty 1

## 2021-12-10 MED ORDER — ACETAMINOPHEN 500 MG PO TABS
ORAL_TABLET | ORAL | Status: AC
  Administered 2021-12-10: 1000 mg via ORAL
  Filled 2021-12-10: qty 1

## 2021-12-10 NOTE — Discharge Instructions (Signed)
Continue your gabapentin and methocarbamol.  Please use lidocaine patches at your site of pain.  Apply 1 patch at a time, leave on for 12 hours, then remove for 12 hours.  12 hours on, 12 hours off.  Do not apply more than 1 patch at a time.  Use Tylenol for pain and fevers.  Up to 1000 mg per dose, up to 4 times per day.  Do not take more than 4000 mg of Tylenol/acetaminophen within 24 hours.Marland Kitchen

## 2021-12-10 NOTE — ED Provider Notes (Signed)
[facility] Provider Note    Event Date/Time   First MD Initiated Contact with Patient 12/10/21 [phone]     (approximate)   History   Fall   HPI  Valerie Hopkins is a 61 y.o. female who presents to the ED for evaluation of Fall   I review outpatient PCP visit from 1/4.  History of obesity, HTN, HLD, vertigo and chronic pain.  Patient presents to the ED, accompanied by a female friend, for evaluation of a mechanical fall that occurred last night.  She reports getting up to void, feeling presyncopal lightheaded dizziness, causing her to stumble to the ground and strike her left hip and buttocks on a carpeted floor.  Denies any syncope and reports her dizziness subsided after she was up onto the ground.  Since that time, she has been ambulatory, but with pain due to left-sided back pain.  Reports chronic history of migraines and headache for which she takes Fioricet, but has not taken this medication yet today.  Reports a typical migrainous headache for her without acute features.   Physical Exam   Triage Vital Signs: ED Triage Vitals  Enc Vitals Group     BP 12/10/21 0828 (!) 183/83     Pulse Rate 12/10/21 0828 (!) 59     Resp 12/10/21 0828 16     Temp 12/10/21 0828 97.9 F (36.6 C)     Temp Source 12/10/21 0828 Oral     SpO2 12/10/21 0828 100 %     Weight 12/10/21 0829 173 lb 15.1 oz (78.9 kg)     Height 12/10/21 0829 5\' 2"  (1.575 m)     Head Circumference --      Peak Flow --      Pain Score 12/10/21 0828 8     Pain Loc --      Pain Edu? --      Excl. in GC? --     Most recent vital signs: Vitals:   12/10/21 1330 12/10/21 1400  BP: 140/88 (!) 156/87  Pulse: 64 (!) 58  Resp: 17 17  Temp:    SpO2: 99% 99%    General: Awake, no distress.  CV:  Good peripheral perfusion.  Resp:  Normal effort.  Abd:  No distention.  MSK:  No deformity noted.  No pain with logrolling bilateral lower extremities.  No deformity to all 4 extremities. Lumbar  paraspinal tenderness to palpation right greater than left.  No midline spinal tenderness. Neuro:  No focal deficits appreciated. Cranial nerves II through XII intact 5/5 strength and sensation in all 4 extremities  Other:     ED Results / Procedures / Treatments   Labs (all labs ordered are listed, but only abnormal results are displayed) Labs Reviewed  URINALYSIS, ROUTINE W REFLEX MICROSCOPIC - Abnormal; Notable for the following components:      Result Value   Color, Urine STRAW (*)    APPearance CLEAR (*)    All other components within normal limits  BASIC METABOLIC PANEL - Abnormal; Notable for the following components:   Glucose, Bld 104 (*)    All other components within normal limits  CBC  CBG MONITORING, ED    EKG  Sinus rhythm, rate of 58 bpm.  Normal axis and intervals.  No evidence of acute ischemia.  Low voltage.  RADIOLOGY Plain films of the lumbar spine, pelvis, left hip and left femur reviewed by me without evidence of fracture or dislocation. CT head reviewed by  me without evidence of acute ICH  Official radiology report(s): DG Lumbar Spine Complete  Result Date: 12/10/2021 CLINICAL DATA:  Pt does not remember what happened before she got out of bed but states that she became dizzy and had a syncopal episode. Pt c/o pain in her lower back and Left hip. Pain generalized in lower back which radiates into left hip.Fall w left sided hip and back pain EXAM: LUMBAR SPINE - COMPLETE 4+ VIEW COMPARISON:  None. FINDINGS: Normal alignment of lumbar vertebral bodies. No loss of vertebral body height or disc height. No pars fracture. No subluxation. LEFT hip fixation IMPRESSION: Normal lumbar radiograph. Electronically Signed   By: Genevive BiStewart  Edmunds M.D.   On: 12/10/2021 11:58   CT HEAD WO CONTRAST (5MM)  Result Date: 12/10/2021 CLINICAL DATA:  Fall with occipital trauma, dizzy EXAM: CT HEAD WITHOUT CONTRAST TECHNIQUE: Contiguous axial images were obtained from the base of  the skull through the vertex without intravenous contrast. RADIATION DOSE REDUCTION: This exam was performed according to the departmental dose-optimization program which includes automated exposure control, adjustment of the mA and/or kV according to patient size and/or use of iterative reconstruction technique. COMPARISON:  None. FINDINGS: Brain: No evidence of acute infarction, hemorrhage, cerebral edema, mass, mass effect, or midline shift. No hydrocephalus or extra-axial fluid collection. Vascular: No hyperdense vessel. Skull: Normal. Negative for fracture or focal lesion. Sinuses/Orbits: No acute finding. Other: The mastoid air cells are well aerated. IMPRESSION: IMPRESSION No acute intracranial process. Electronically Signed   By: Wiliam KeAlison  Vasan M.D.   On: 12/10/2021 11:44   DG HIP UNILAT WITH PELVIS 2-3 VIEWS LEFT  Result Date: 12/10/2021 CLINICAL DATA:  Left hip pain. EXAM: DG HIP (WITH OR WITHOUT PELVIS) 2-3V LEFT COMPARISON:  02/12/2018 FINDINGS: Bones are diffusely demineralized. Frontal pelvis shows no acute fracture. Patient is status post ORIF for proximal left femur fracture. Appearance is stable in the interval. The entire long antegrade left femoral nail has not been included on this study. IMPRESSION: 1. No acute bony abnormality with the limitation that the entire left femoral IM nail has not been included on the exam. If there is concern for left femur fracture, dedicated left femur films recommended. Electronically Signed   By: Kennith CenterEric  Mansell M.D.   On: 12/10/2021 11:53   DG FEMUR MIN 2 VIEWS LEFT  Result Date: 12/10/2021 CLINICAL DATA:  Recent fall EXAM: LEFT FEMUR 2 VIEWS COMPARISON:  X-ray dated February 12, 2017 FINDINGS: Prior intramedullary nail fixation of the left femur. No evidence of acute fracture or dislocation. Soft tissues are unremarkable. IMPRESSION: No acute osseous abnormality. Electronically Signed   By: Allegra LaiLeah  Strickland M.D.   On: 12/10/2021 14:45    PROCEDURES and  INTERVENTIONS:  .1-3 Lead EKG Interpretation Performed by: Delton PrairieSmith, Madeleine Fenn, MD Authorized by: Delton PrairieSmith, Mckay Brandt, MD     Interpretation: normal     ECG rate:  60   ECG rate assessment: normal     Rhythm: sinus rhythm     Ectopy: none     Conduction: normal    Medications  lidocaine (LIDODERM) 5 % 1 patch (1 patch Transdermal Patch Applied 12/10/21 1504)  acetaminophen (TYLENOL) tablet 1,000 mg (1,000 mg Oral Given 12/10/21 1213)  meclizine (ANTIVERT) tablet 25 mg (25 mg Oral Given 12/10/21 1213)  methocarbamol (ROBAXIN) tablet 500 mg (500 mg Oral Given 12/10/21 1556)  gabapentin (NEURONTIN) capsule 400 mg (400 mg Oral Given 12/10/21 1556)  oxyCODONE (Oxy IR/ROXICODONE) immediate release tablet 5 mg (5 mg Oral Given  12/10/21 1504)     IMPRESSION / MDM / ASSESSMENT AND PLAN / ED COURSE  I reviewed the triage vital signs and the nursing notes.  61 year old female presents to the ED with left-sided back pain after a fall last night, without evidence of acute traumatic pathology, and suitable for outpatient management.  She looks clinically well to me.  No signs of neurologic vascular deficits.  No gross deformity or signs of significant trauma.  Has vague tenderness paraspinally to her lumbar back.  Plain films of her back, left hip and pelvis without evidence of fracture or dislocation.  Scan the head without evidence of ICH or CVA.  Blood work is benign with normal CBC, metabolic panel and urine without infectious features.  We will provide her with her typical chronic pain medications as well as single dose of oxycodone.  Discharged with lidocaine patches and we discussed return precautions for the ED.  Clinical Course as of 12/10/21 1557  Fri Dec 10, 2021  1452 Reassessed.  Requesting chronic analgesic medications.  We discussed management at home and work-up. [DS]    Clinical Course User Index [DS] Delton Prairie, MD     FINAL CLINICAL IMPRESSION(S) / ED DIAGNOSES   Final diagnoses:  Fall      Rx / DC Orders   ED Discharge Orders          Ordered    lidocaine (LIDODERM) 5 %  Every 12 hours        12/10/21 1555             Note:  This document was prepared using Dragon voice recognition software and may include unintentional dictation errors.   Delton Prairie, MD 12/10/21 [phone]

## 2021-12-10 NOTE — ED Notes (Signed)
Pt here after a fall last night. Pt does not remember what happened before she got out of bed but states that she became dizzy and had a syncopal episode. Pt c/o pain in her back and head.

## 2021-12-10 NOTE — ED Triage Notes (Addendum)
C/O headache last night and back pain.  This morning got up out of bed, felt as if she was urinating on self.  Patient states "I got up too fast and fell.  My head was spinning and I should have sat on the bed for a minute"".  Fell to carpeted floor.  C/O headache and back pain.

## 2021-12-16 ENCOUNTER — Emergency Department: Payer: No Typology Code available for payment source

## 2021-12-16 ENCOUNTER — Other Ambulatory Visit: Payer: Self-pay

## 2021-12-16 ENCOUNTER — Emergency Department
Admission: EM | Admit: 2021-12-16 | Discharge: 2021-12-16 | Disposition: A | Discharge status: Home / Self Care | Payer: No Typology Code available for payment source | Attending: Emergency Medicine | Admitting: Emergency Medicine

## 2021-12-16 DIAGNOSIS — I779 Disorder of arteries and arterioles, unspecified: Secondary | ICD-10-CM | POA: Diagnosis not present

## 2021-12-16 DIAGNOSIS — M5030 Other cervical disc degeneration, unspecified cervical region: Secondary | ICD-10-CM | POA: Insufficient documentation

## 2021-12-16 DIAGNOSIS — M5137 Other intervertebral disc degeneration, lumbosacral region: Secondary | ICD-10-CM | POA: Diagnosis not present

## 2021-12-16 DIAGNOSIS — R202 Paresthesia of skin: Secondary | ICD-10-CM

## 2021-12-16 DIAGNOSIS — I7789 Other specified disorders of arteries and arterioles: Secondary | ICD-10-CM

## 2021-12-16 DIAGNOSIS — M5440 Lumbago with sciatica, unspecified side: Secondary | ICD-10-CM

## 2021-12-16 DIAGNOSIS — M5126 Other intervertebral disc displacement, lumbar region: Secondary | ICD-10-CM | POA: Diagnosis not present

## 2021-12-16 DIAGNOSIS — I10 Essential (primary) hypertension: Secondary | ICD-10-CM | POA: Insufficient documentation

## 2021-12-16 DIAGNOSIS — M5441 Lumbago with sciatica, right side: Secondary | ICD-10-CM | POA: Insufficient documentation

## 2021-12-16 LAB — URINALYSIS, COMPLETE (UACMP) WITH MICROSCOPIC
Bacteria, UA: NONE SEEN
Bilirubin Urine: NEGATIVE
Glucose, UA: NEGATIVE mg/dL
Hgb urine dipstick: NEGATIVE
Leukocytes,Ua: NEGATIVE
Nitrite: NEGATIVE
Protein, ur: NEGATIVE mg/dL
Specific Gravity, Urine: 1.025 (ref 1.005–1.030)
pH: 6 (ref 5.0–8.0)

## 2021-12-16 IMAGING — MR MR LUMBAR SPINE W/O CM
5 series · 31 of 48 positions shown · non-contrast
Comparison: Lumbar spine radiographs [DATE]

CLINICAL DATA: Low back pain, dizziness

EXAM:
MRI LUMBAR SPINE WITHOUT CONTRAST
TECHNIQUE: Multiplanar, multisequence MR imaging of the lumbar spine was
performed. No intravenous contrast was administered.

[Series 5: T2 · sagittal · 4.0mm · 0.81mm/px · 7 of 17 slices shown (1 of 2)]
[im 1/17]
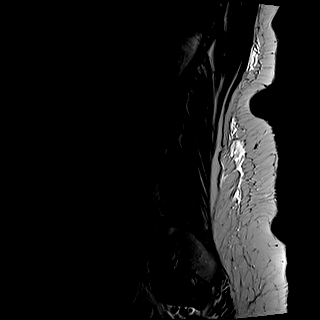
[im 3/17]
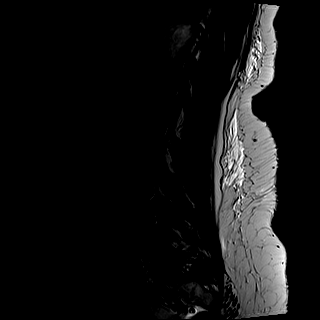
[im 6/17]
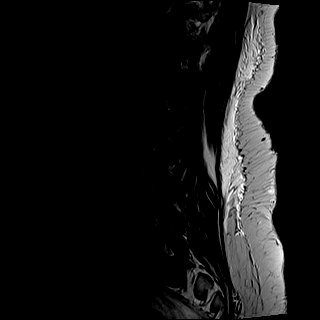
[im 9/17]
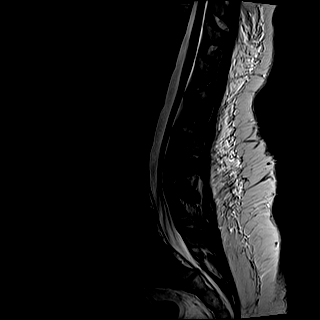
[im 11/17]
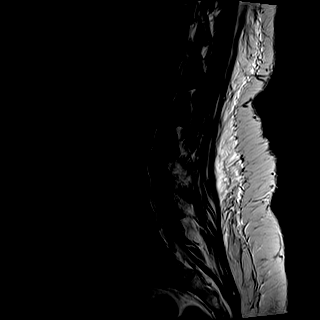
[im 14/17]
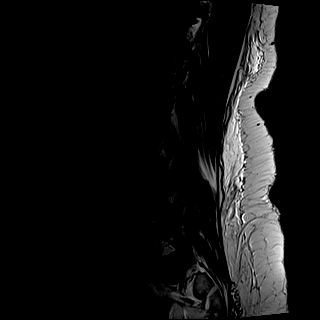
[im 17/17]
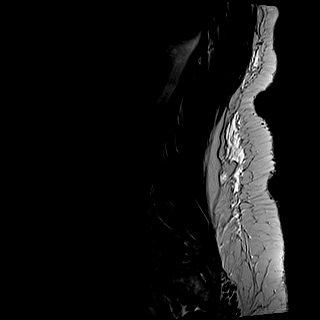

[Series 6: T1 · sagittal · 4.0mm · 0.81mm/px · 7 of 17 slices shown (1 of 2)]
[im 1/17]
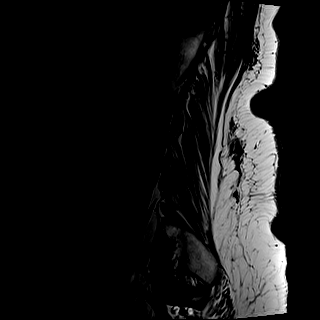
[im 3/17]
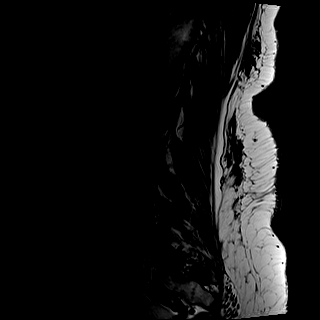
[im 6/17]
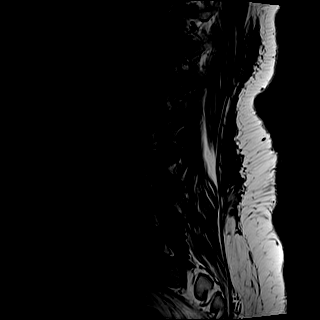
[im 9/17]
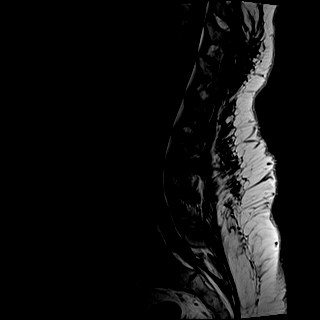
[im 11/17]
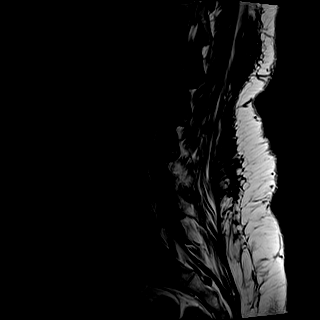
[im 14/17]
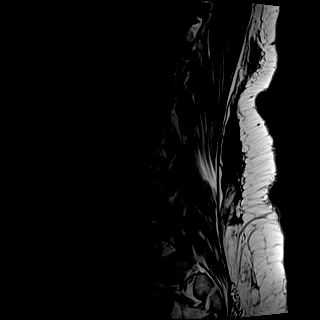
[im 17/17]
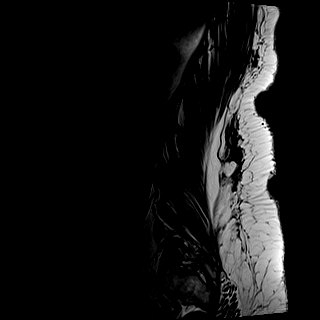

[Series 7: STIR · sagittal · 4.0mm · 0.41mm/px · 1 of 17 slices shown]
[im 1/17]
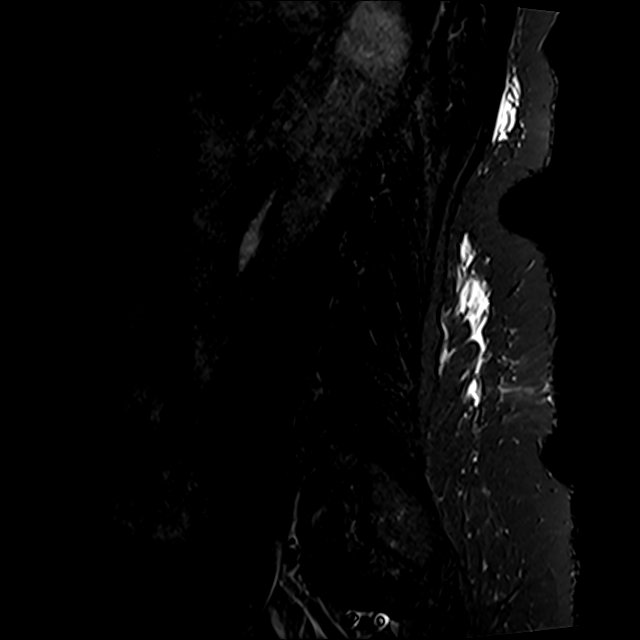

[Series 8: T2 · axial · 4.0mm · 0.78mm/px · z∈[-87,+137]mm · 8 of 38 slices shown (2 of 2)]
[im 1/38]
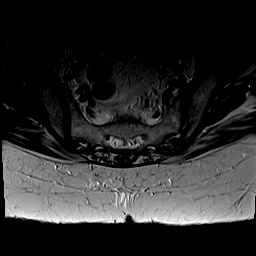
[im 6/38]
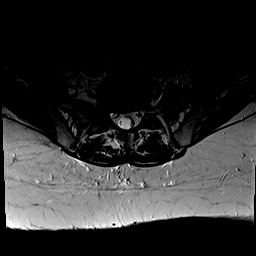
[im 12/38]
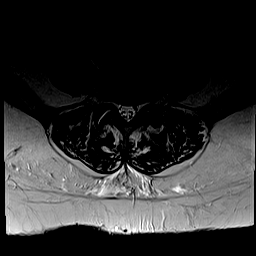
[im 18/38]
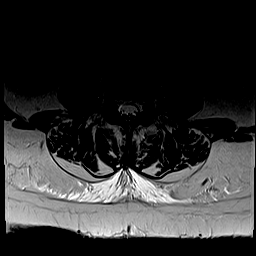
[im 20/38]
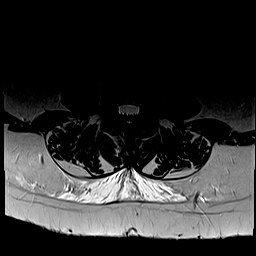
[im 26/38]
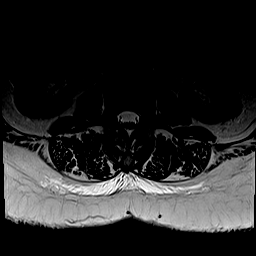
[im 32/38]
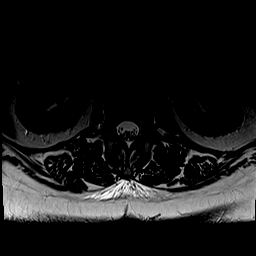
[im 38/38]
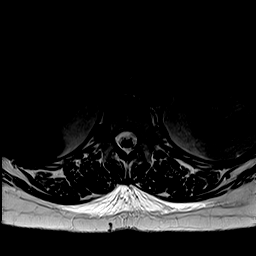

[Series 9: T1 · axial · 4.0mm · 0.39mm/px · z∈[-87,+137]mm · 8 of 38 slices shown (2 of 2)]
[im 1/38]
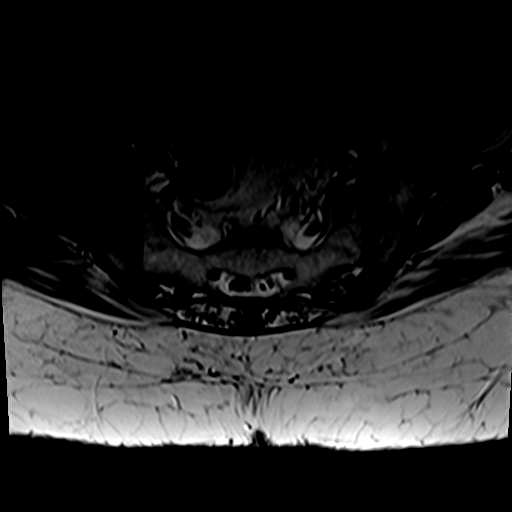
[im 6/38]
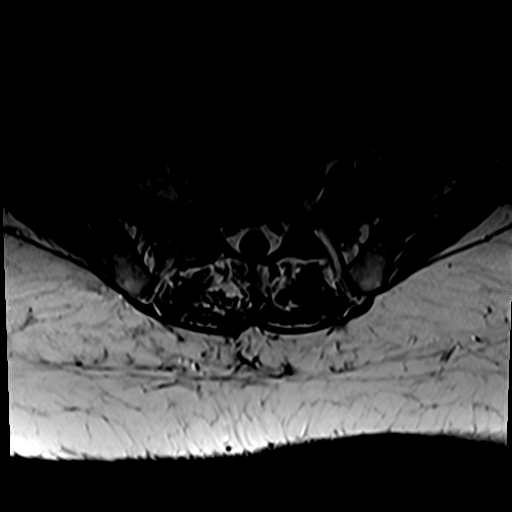
[im 12/38]
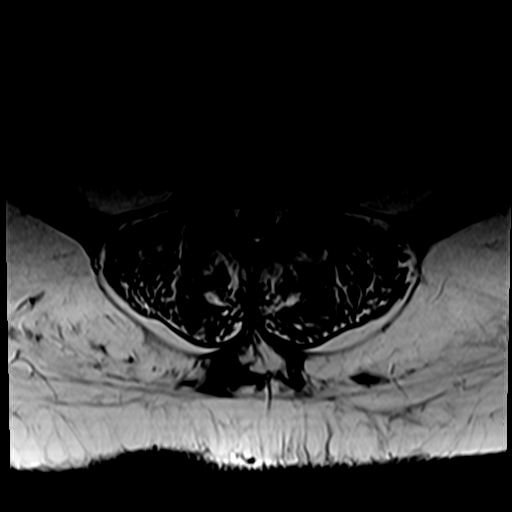
[im 18/38]
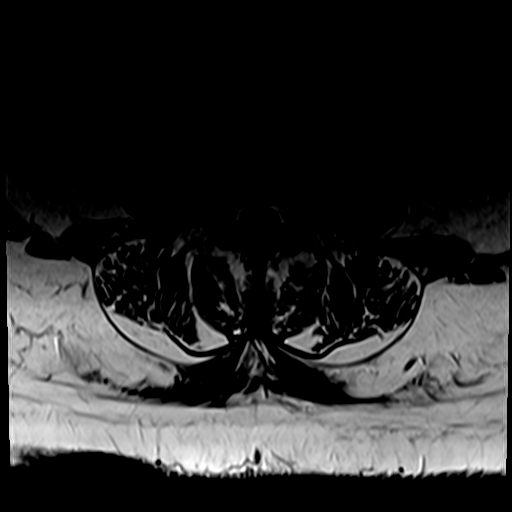
[im 20/38]
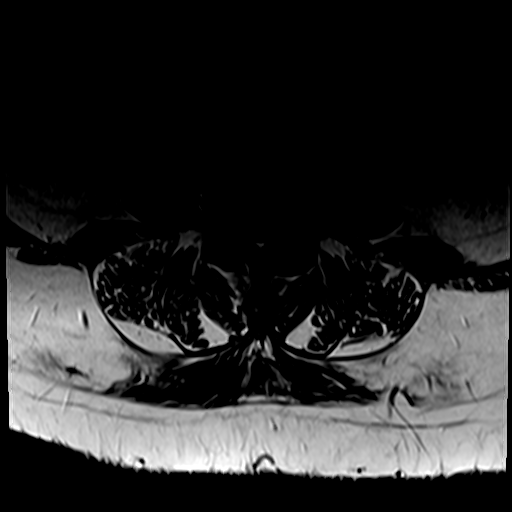
[im 26/38]
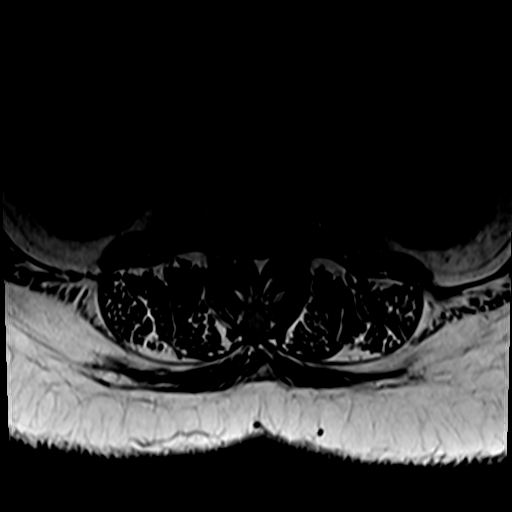
[im 32/38]
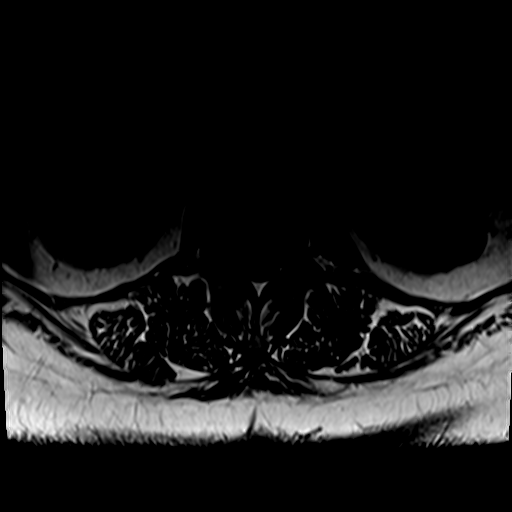
[im 38/38]
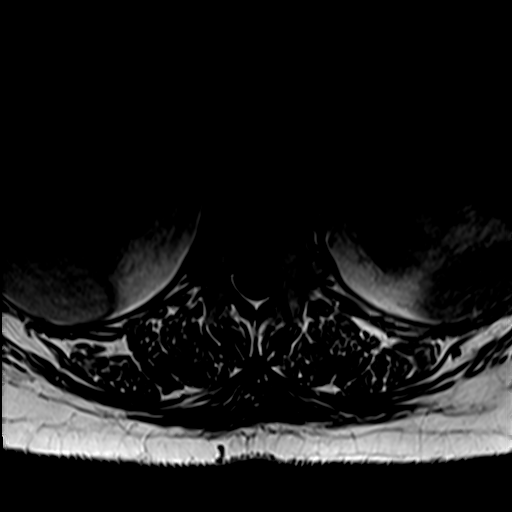

[31 of 48 positions shown; findings below may reference images not displayed]

FINDINGS: Segmentation: Standard; the lowest formed disc space is designated
L5-S1

Alignment:  Normal.

Vertebrae: Vertebral body heights are preserved. Marrow signal is
normal. There is no marrow edema or other acute finding.

Conus medullaris and cauda equina: Conus extends to the L1 level.
Conus and cauda equina appear normal.

Paraspinal and other soft tissues: Unremarkable.

Disc levels:

T12-L1: No significant spinal canal or neural foraminal stenosis.

L1-L2: No significant spinal canal or neural foraminal stenosis.

L2-L3: There is a mild central protrusion and minimal facet
arthropathy without significant spinal canal or neural foraminal
stenosis.

L3-L4: There is a mild disc bulge and mild bilateral facet
arthropathy with small effusions and left perifacetal soft tissue
edema. There is mild left and no significant right neural foraminal
stenosis and no significant spinal canal stenosis

L4-L5: There is a mild disc bulge and mild bilateral facet
arthropathy with small effusions resulting in mild left and no
significant right neural foraminal stenosis and no significant
spinal canal stenosis.

L5-S1: No significant spinal canal or neural foraminal stenosis.
IMPRESSION: 1. Mild facet arthropathy at L3-L4 and L4-L5 with associated
effusions and perifacetal soft tissue edema on the left at L3-L4,
which reflect a source of pain.
2. Mild disc bulges at L3-L4 and L4-L5 resulting in up to mild left
neural foraminal stenosis at L4-L5.
3. Otherwise, minimal degenerative changes without significant
spinal canal or neural foraminal stenosis, and no evidence of nerve
root impingement.

## 2021-12-16 IMAGING — MR MR CERVICAL SPINE W/O CM
5 series · 39 of 48 positions shown · non-contrast
Comparison: None.

CLINICAL DATA: Myelopathy, acute, cervical spine

EXAM:
MRI CERVICAL SPINE WITHOUT CONTRAST
TECHNIQUE: Multiplanar, multisequence MR imaging of the cervical spine was
performed. No intravenous contrast was administered.

[Series 5: T2 · sagittal · 3.0mm · 0.62mm/px · 8 of 15 slices shown (1 of 2)]
[im 1/15]
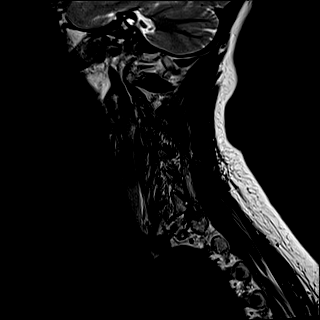
[im 3/15]
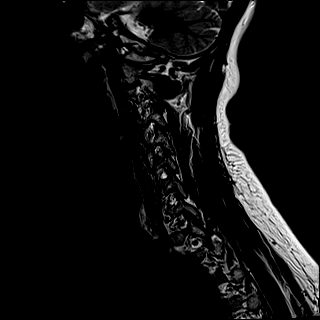
[im 5/15]
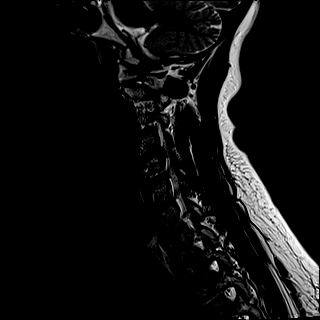
[im 7/15]
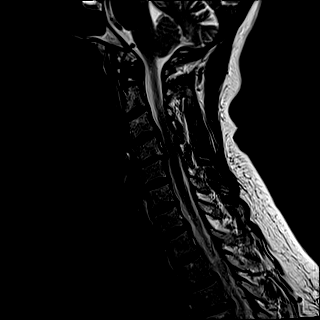
[im 9/15]
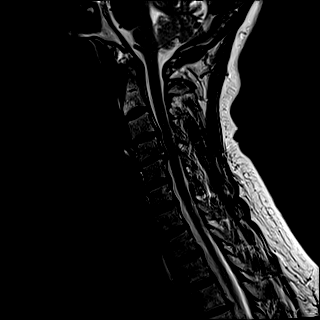
[im 11/15]
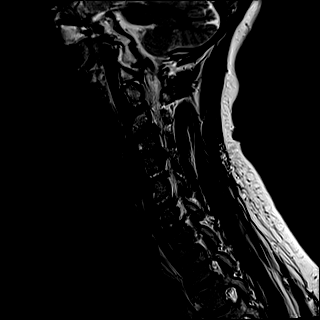
[im 13/15]
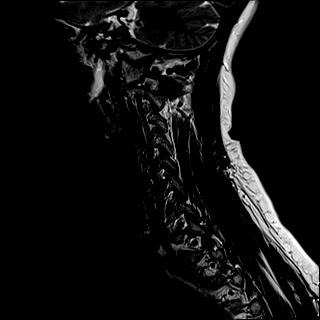
[im 15/15]
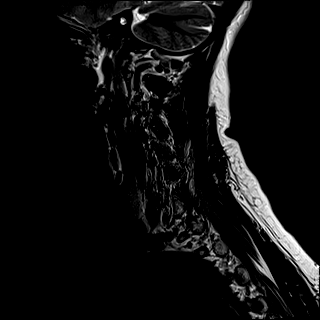

[Series 6: FLAIR · sagittal · 3.0mm · 0.78mm/px · 7 of 15 slices shown]
[im 1/15]
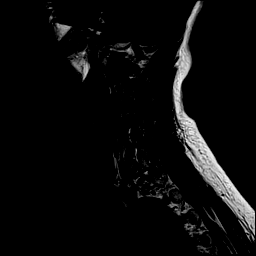
[im 3/15]
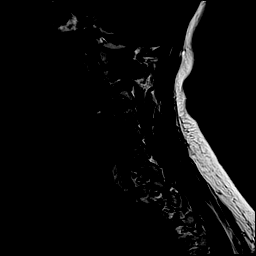
[im 5/15]
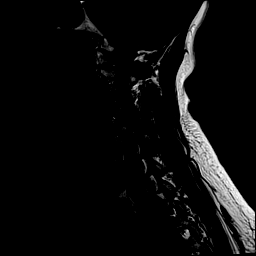
[im 8/15]
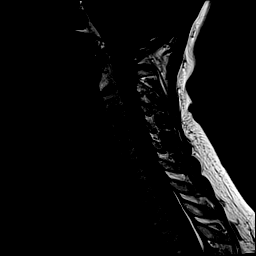
[im 10/15]
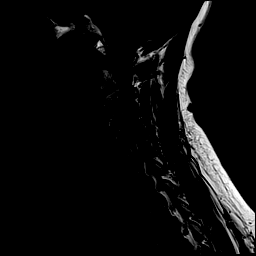
[im 12/15]
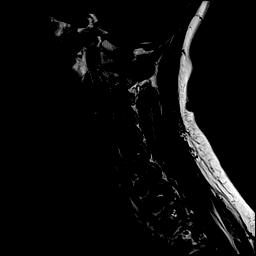
[im 15/15]
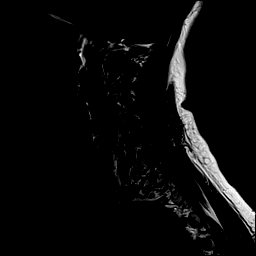

[Series 7: STIR · sagittal · 3.0mm · 0.62mm/px · 7 of 15 slices shown]
[im 1/15]
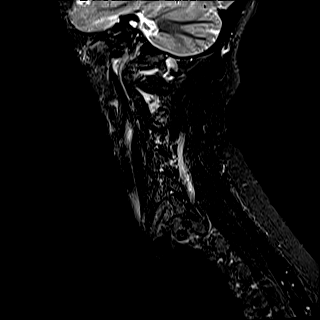
[im 3/15]
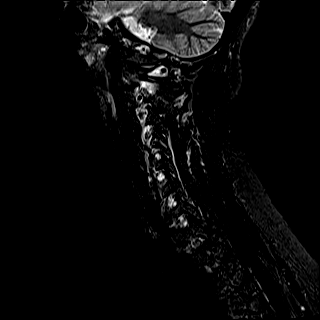
[im 5/15]
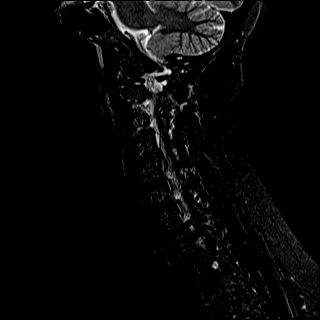
[im 8/15]
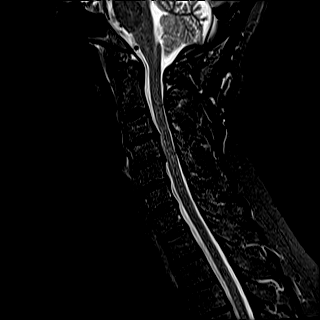
[im 10/15]
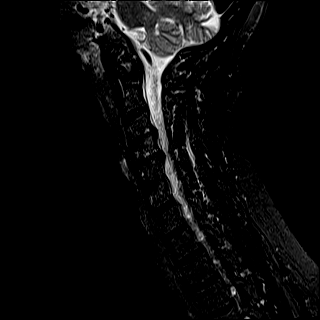
[im 12/15]
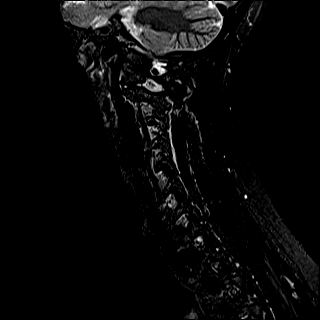
[im 15/15]
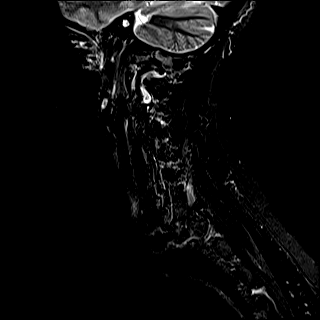

[Series 8: T2 · axial · 3.0mm · 0.70mm/px · z∈[-75,+16]mm · 9 of 28 slices shown (2 of 2)]
[im 1/28]
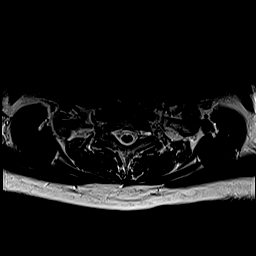
[im 5/28]
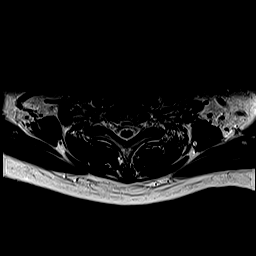
[im 10/28]
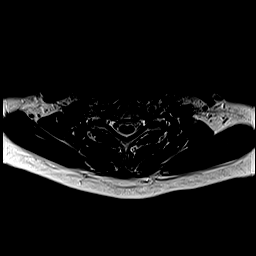
[im 12/28]
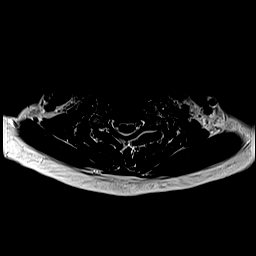
[im 14/28]
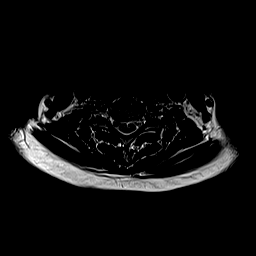
[im 16/28]
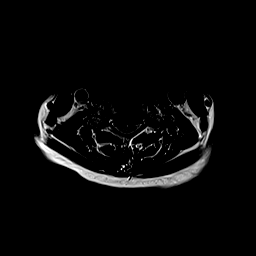
[im 19/28]
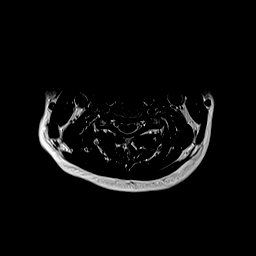
[im 23/28]
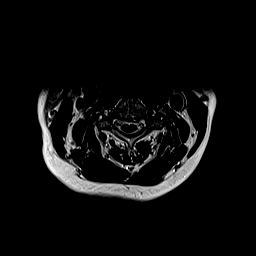
[im 28/28]
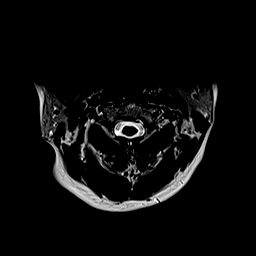

[Series 9: ax mpgr · axial · 3.0mm · 0.35mm/px · z∈[-75,+16]mm · 8 of 28 slices shown]
[im 1/28]
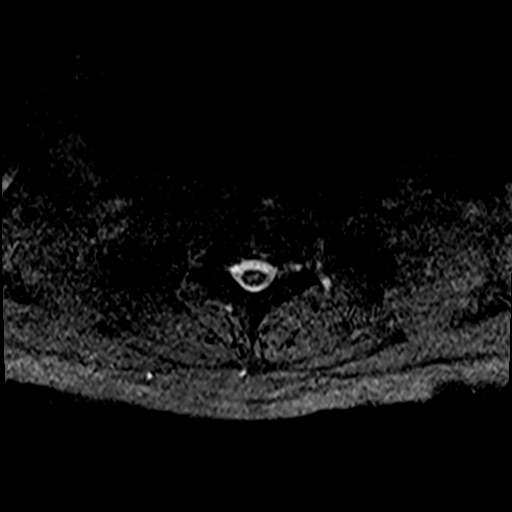
[im 5/28]
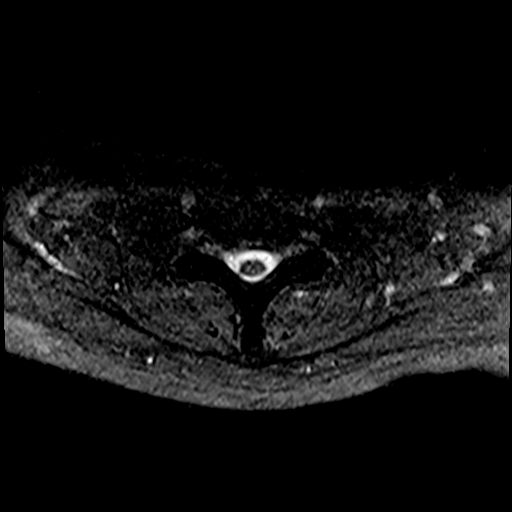
[im 10/28]
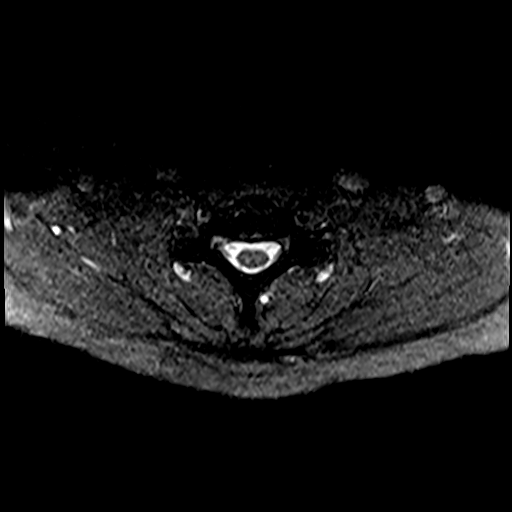
[im 12/28]
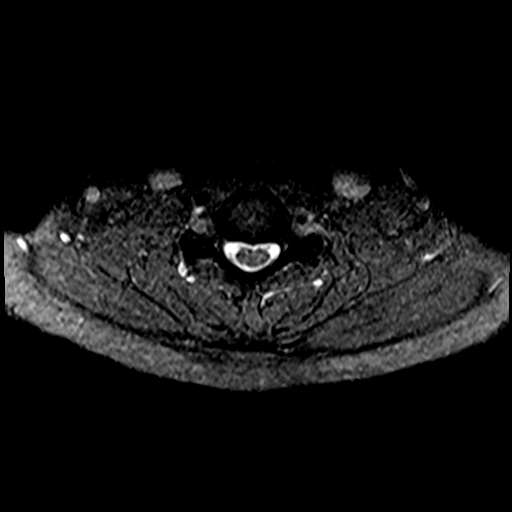
[im 16/28]
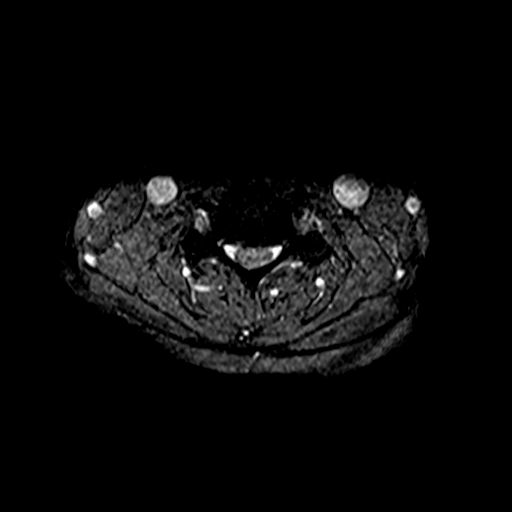
[im 19/28]
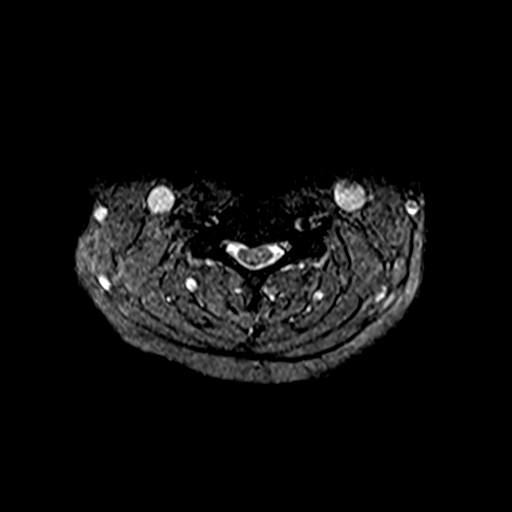
[im 23/28]
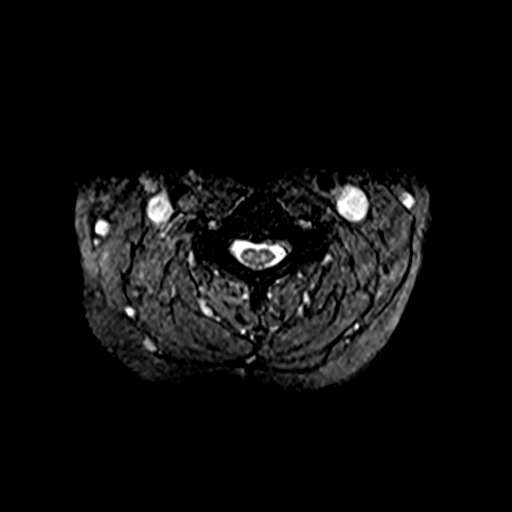
[im 28/28]
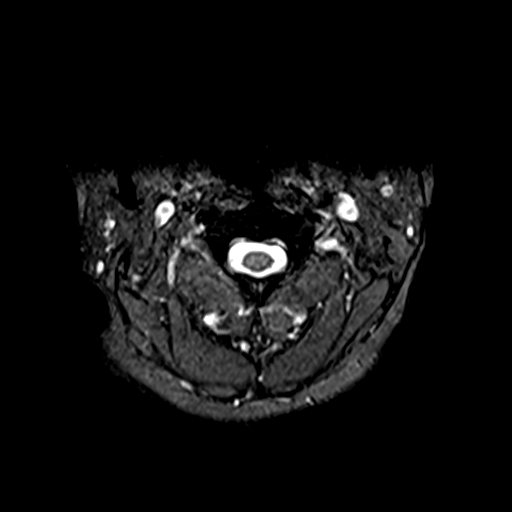

[39 of 48 positions shown; findings below may reference images not displayed]

FINDINGS: Alignment: Straightening of the cervical lordosis. No significant
listhesis.

Vertebrae: No fracture, evidence of discitis, or bone lesion.
Minimal discogenic endplate marrow changes at C4-5.

Cord: Normal signal and morphology.

Posterior Fossa, vertebral arteries, paraspinal tissues: Negative.

Disc levels:

C2-C3: No significant disc protrusion, foraminal stenosis, or canal
stenosis.

C3-C4: Diffuse disc bulge resulting in impress upon the ventral cord
and moderate canal stenosis. Mild bilateral foraminal stenosis.

C4-C5: Disc osteophyte complex resulting in impress on the cervical
cord and moderate to severe canal stenosis. Uncovertebral spurring
contributes to moderate bilateral foraminal stenosis.

C5-C6: Shallow disc protrusion.  No foraminal or canal stenosis.

C6-C7: No significant disc protrusion, foraminal stenosis, or canal
stenosis.

C7-T1: Small focal right paracentral disc protrusion without
resultant foraminal or canal stenosis.
IMPRESSION: 1. Multilevel degenerative changes of the cervical spine, most
pronounced at C4-5 where there is moderate-to-severe canal stenosis
and moderate bilateral foraminal stenosis.
2. Moderate canal stenosis and mild bilateral foraminal stenosis at
C3-4.
3. No cervical cord signal changes.

## 2021-12-16 IMAGING — MR MR THORACIC SPINE W/O CM
6 series · 29 of 48 positions shown · non-contrast
Comparison: None.

CLINICAL DATA: Back trauma, abnormal neuro exam, CT or xray
positive (Age >= 16y)

EXAM:
MRI THORACIC SPINE WITHOUT CONTRAST
TECHNIQUE: Multiplanar, multisequence MR imaging of the thoracic spine was
performed. No intravenous contrast was administered.

[Series 16: T1 · sagittal · 5.0mm · 1.88mm/px · 2 of 9 slices shown (1 of 2)]
[im 1/9]
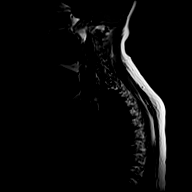
[im 9/9]
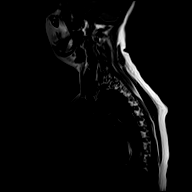

[Series 17: T2 · sagittal · 3.0mm · 1.06mm/px · 6 of 17 slices shown (1 of 2)]
[im 1/17]
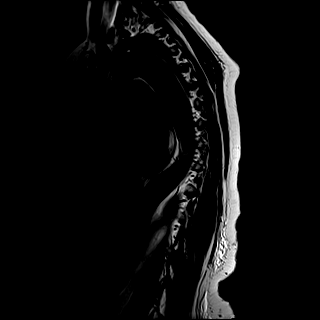
[im 4/17]
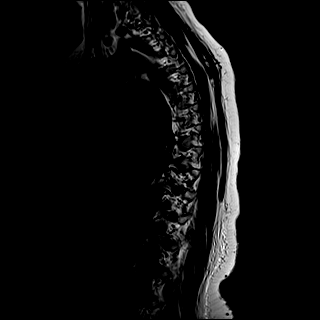
[im 7/17]
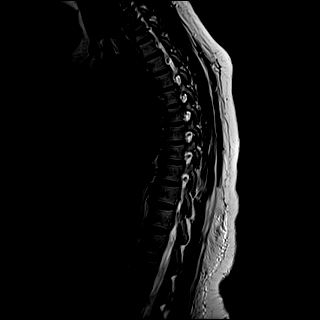
[im 10/17]
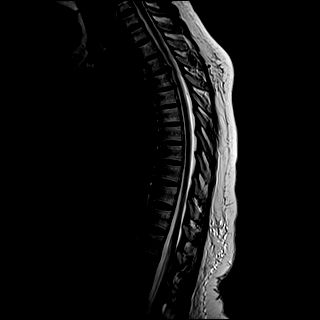
[im 13/17]
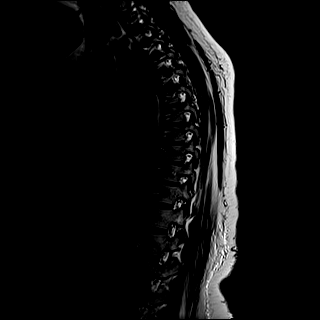
[im 17/17]
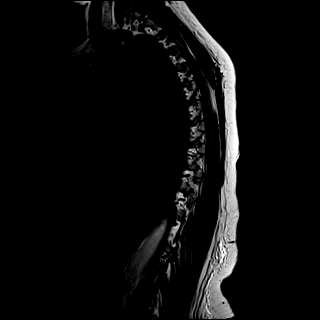

[Series 18: T1 · sagittal · 3.0mm · 1.06mm/px · 6 of 17 slices shown (2 of 2)]
[im 1/17]
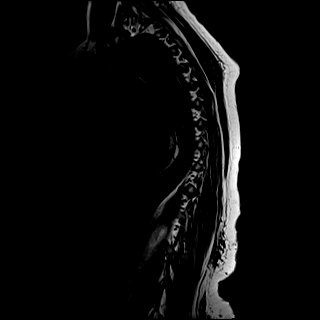
[im 4/17]
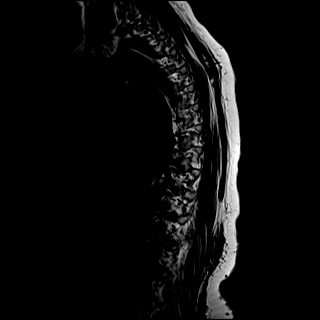
[im 7/17]
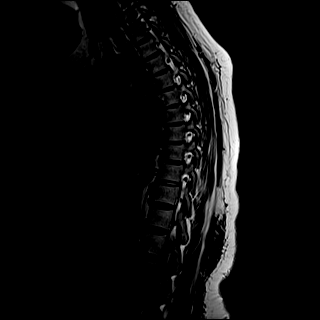
[im 10/17]
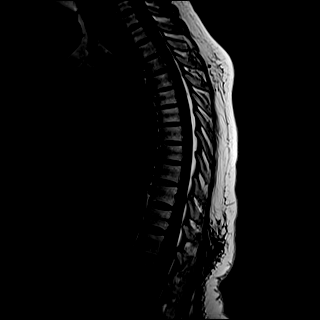
[im 13/17]
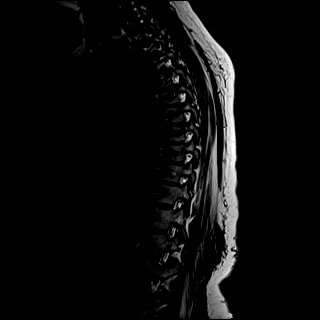
[im 17/17]
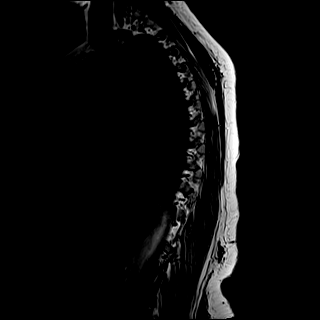

[Series 19: STIR · sagittal · 3.0mm · 0.53mm/px · 6 of 17 slices shown]
[im 1/17]
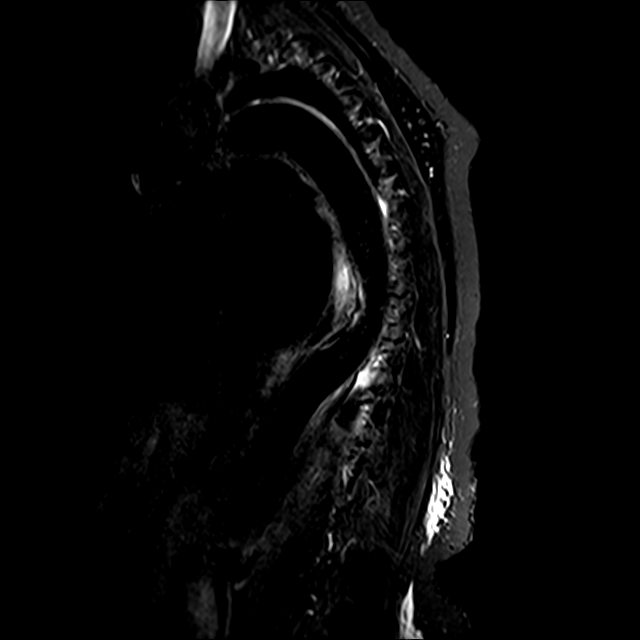
[im 4/17]
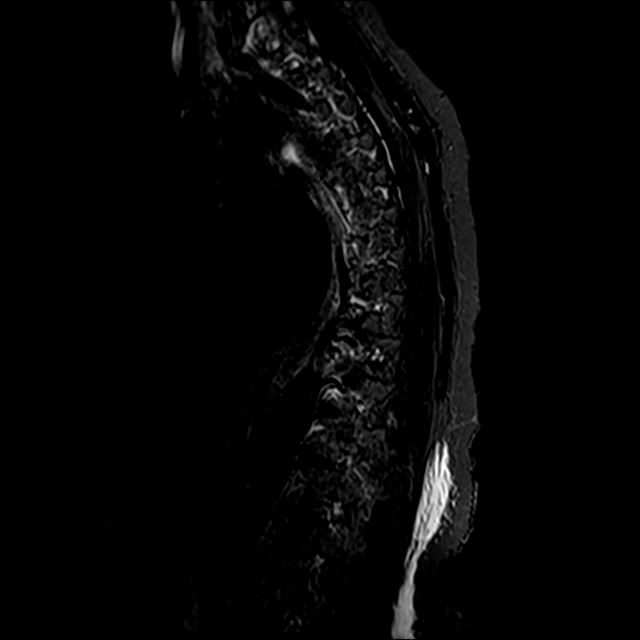
[im 7/17]
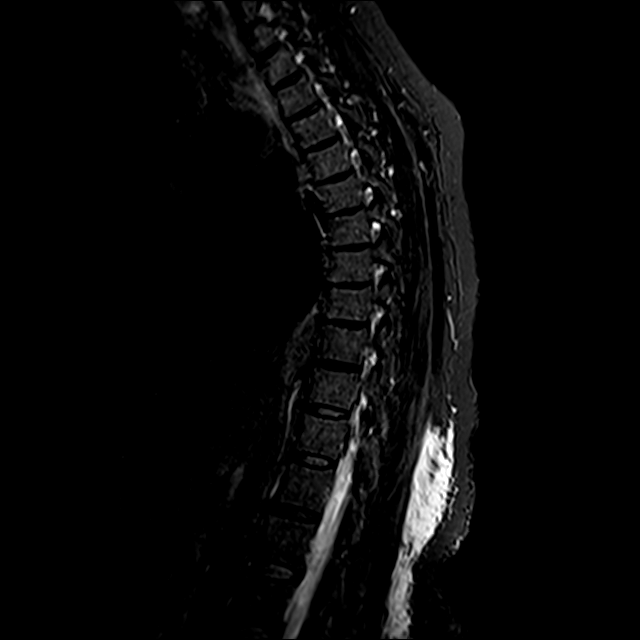
[im 10/17]
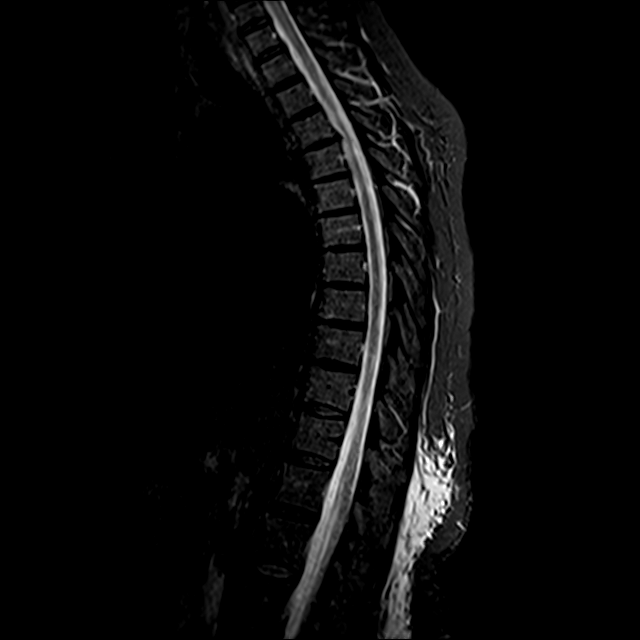
[im 13/17]
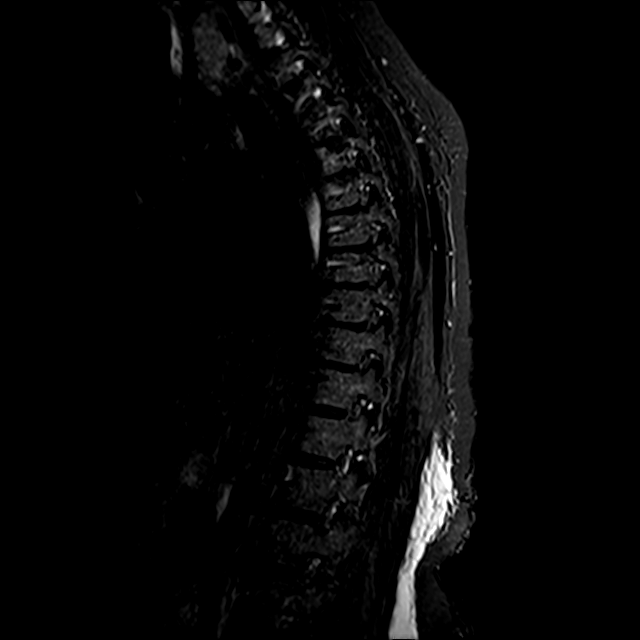
[im 17/17]
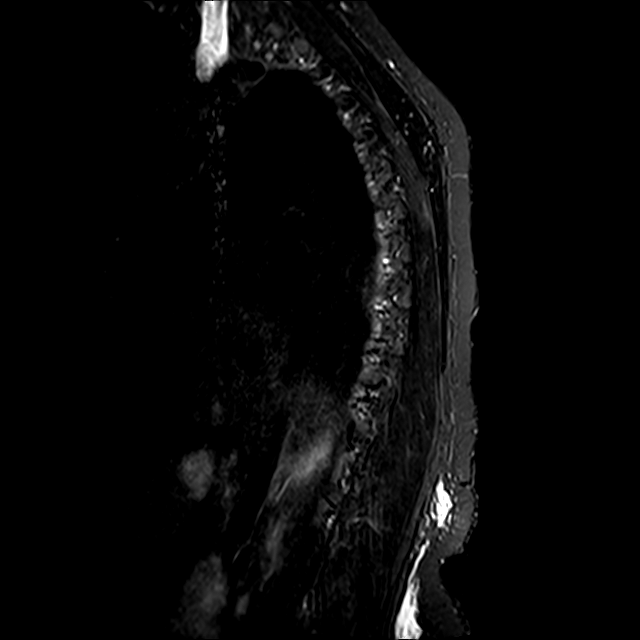

[Series 20: T2 · axial · 4.0mm · 0.59mm/px · z∈[-270,-84]mm · 8 of 39 slices shown (2 of 2)]
[im 1/39]
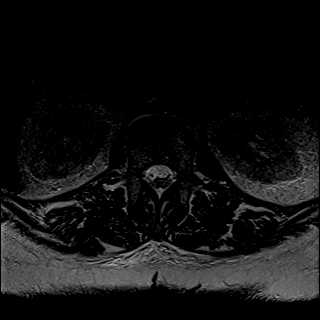
[im 6/39]
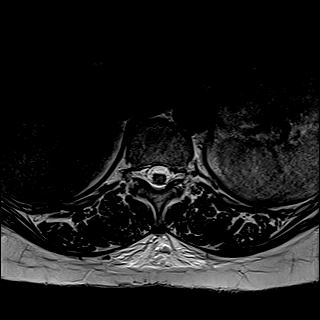
[im 12/39]
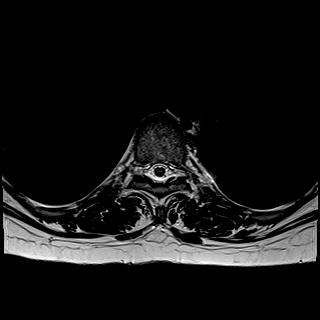
[im 18/39]
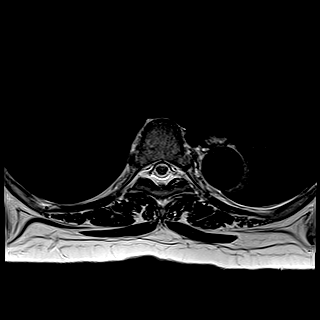
[im 21/39]
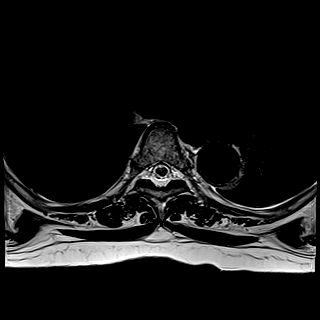
[im 27/39]
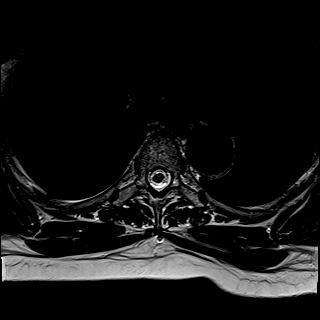
[im 33/39]
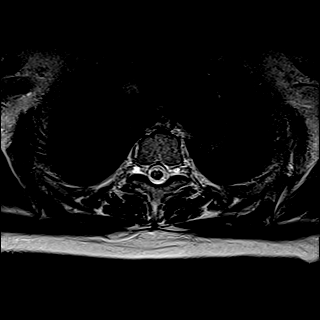
[im 39/39]
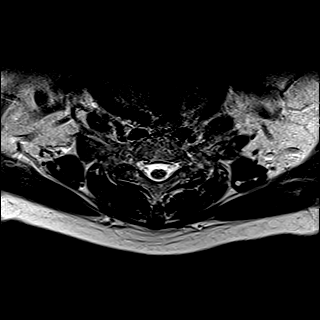

[Series 21: GRE · axial · 4.0mm · 0.37mm/px · 1 of 39 slices shown]
[im 1/39]
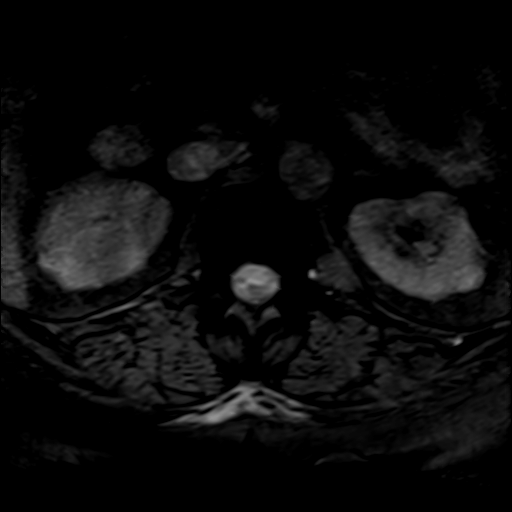

[29 of 48 positions shown; findings below may reference images not displayed]

FINDINGS: Alignment:  Physiologic.

Vertebrae: No fracture, evidence of discitis, or bone lesion.

Cord:  Normal signal and morphology.

Paraspinal and other soft tissues: Thoracic aortic arch measures
approximately 3.1 cm in transverse diameter. Nonspecific dependent
subcutaneous edema at the lower thoracic and lumbar levels.

Disc levels:

T1-T2: Minimal disc bulge.  No foraminal or canal stenosis.

T2-T3: No significant canal or neural foraminal narrowing.

T3-T4: Right paracentral disc protrusion without significant
foraminal or canal stenosis.

T4-T5: Minimal disc bulge.  No foraminal or canal stenosis.

T5-T6: Shallow left paracentral disc protrusion. No foraminal or
canal stenosis.

T6-T7: No significant canal or neural foraminal narrowing.

T7-T8: No significant canal or neural foraminal narrowing.

T8-T9: No significant canal or neural foraminal narrowing.

T9-T10: No significant canal or neural foraminal narrowing.

T10-T11: No significant canal or neural foraminal narrowing.

T11-T12: No significant canal or neural foraminal narrowing.
IMPRESSION: 1. Mild degenerative disc disease of the thoracic spine without
significant foraminal or canal stenosis at any level.
2. No acute osseous abnormality or abnormal cord signal.
3. Thoracic aortic arch measures approximately 3.1 cm in diameter.
Recommend annual imaging followup by CTA or MRA. This recommendation
follows [NE] ACCF/AHA/AATS/ACR/ASA/SCA/KUMOBAH/KUMOBAH/KUMOBAH/KUMOBAH Guidelines
for the Diagnosis and Management of Patients with Thoracic Aortic
Disease. Circulation.[NE]; 121: [id number]. Aortic aneurysm NOS
([NE]-[NE])

## 2021-12-16 MED ORDER — KETOROLAC TROMETHAMINE 30 MG/ML IJ SOLN
30.0000 mg | Freq: Once | INTRAMUSCULAR | Status: AC
  Administered 2021-12-16: 30 mg via INTRAMUSCULAR
  Filled 2021-12-16: qty 1

## 2021-12-16 MED ORDER — OXYCODONE-ACETAMINOPHEN 5-325 MG PO TABS
1.0000 | ORAL_TABLET | Freq: Once | ORAL | Status: AC
  Administered 2021-12-16: 1 via ORAL
  Filled 2021-12-16: qty 1

## 2021-12-16 NOTE — ED Provider Notes (Signed)
[facility] Provider Note    Event Date/Time   First MD Initiated Contact with Patient 12/16/21 1326     (approximate)   History   Back Pain and Fall   HPI  Valerie Hopkins is a 61 y.o. female with past medical history of hypertension migraine headaches as well as recent fall on 1/19 after a syncopal episode I am evaluated emergency room the following day on 1/20 with reassuring work-up including normal plain films of the lumbar spine, left femur left hip presents for assessment of some persistent pain in this area.  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  She states that over the last 2 or 3 days she has developed some urinary and stool incontinence and has to wear depends.  He states the pain was initially more in the right side of her back but now has migrated more in the left.  She does feel weaker in her right leg as required a cane to get around.  Has not had any fevers, burning with urination, blood in her stool or urine, abdominal pain, chest pain, cough, shortness of breath, headache, earache, sore throat or any new weakness in the arms or any other areas of numbness.  No saddle anesthesia.  She denies any history of malignancy steroid use or IV drug use.  He has been taking gabapentin and Flexeril and Tylenol without any relief of her back pain.     Past Medical History:  Diagnosis Date   Hypertension    Migraine     Physical Exam  Triage Vital Signs: ED Triage Vitals  Enc Vitals Group     BP 12/16/21 1300 (!) 156/100     Pulse Rate 12/16/21 1300 84     Resp 12/16/21 1259 20     Temp 12/16/21 1259 98.2 F (36.8 C)     Temp src --      SpO2 12/16/21 1300 100 %     Weight 12/16/21 1300 173 lb 15.1 oz (78.9 kg)     Height 12/16/21 1300 5'  2" (1.575 m)     Head Circumference --      Peak Flow --      Pain Score 12/16/21 1300 8     Pain Loc --      Pain Edu? --      Excl. in GC? --     Most recent vital signs: Vitals:   12/16/21 1300 12/16/21 1738  BP: (!) 156/100 115/80  Pulse: 84 77  Resp: 20 16  Temp:  98 F (36.7 C)  SpO2: 100% 100%    General: Awake,  CV:  Good peripheral perfusion.  2+ bilateral radial and DP pulses. Resp:  Normal effort.  Abd:  No distention.  Soft throughout. Other:  Cranial nerves II through grossly intact.  Patient has symmetric strength in the bilateral upper extremities.  She has decree sensation to light touch in the distribution of the ulnar nerve in the right hand but otherwise sensation is intact in the median and radial nerve distributions.  2+ radial pulses.  Sensation intact light touch throughout the bilateral left upper extremities.  Sensation is intact light touch of the lower extremities.  Rectal tone is intact.  2+ patellar reflexes.  Patient does  seem weaker on the right hip flexion extension as well as right knee but somewhat difficult to ascertain if this is effort dependent related to pain or not.  She is tender in her L-spine and upper and lower motions as well as in the bilateral paralumbar regions.  No significant tenderness over the T-spine but she endorses paresthesias on palpation of her trapezius and posterior aspect of her right upper extremity as well.  No overlying skin changes over the back.   ED Results / Procedures / Treatments  Labs (all labs ordered are listed, but only abnormal results are displayed) Labs Reviewed  URINALYSIS, COMPLETE (UACMP) WITH MICROSCOPIC - Abnormal; Notable for the following components:      Result Value   APPearance CLEAR (*)    Ketones, ur TRACE (*)    All other components within normal limits     EKG     RADIOLOGY  MRI C-spine ordered and reviewed by myself shows some degenerative changes with some fairly significant  stenosis around C4 and C5 and C3 and C4.  No abnormal cord signal.  No other acute changes although some degenerative changes noted.  I also reviewed radiology's interpretation and agree with their findings.  MR T-spine shows some degenerative changes but no acute findings and ask spinal cord or bones.  There is evidence of slightly enlarged aortic arch noted by radiology.  I also reviewed the remainder of their interpretation and agree with their findings.  MRI L-spine shows no abnormal cord signal or evidence of cauda equina or conus medullaris.  There is some edema and evidence of arthropathy at L3 and L4 and L4 and L5.  There is also some disc bulging at these levels resulting in left foraminal stenosis without any other clear acute process.    PROCEDURES:  Critical Care performed: No  Procedures    MEDICATIONS ORDERED IN ED: Medications  oxyCODONE-acetaminophen (PERCOCET/ROXICET) 5-325 MG per tablet 1 tablet (has no administration in time range)  ketorolac (TORADOL) 30 MG/ML injection 30 mg (30 mg Intramuscular Given 12/16/21 1416)     IMPRESSION / MDM / ASSESSMENT AND PLAN / ED COURSE  I reviewed the triage vital signs and the nursing notes.                              Differential diagnosis includes, but is not limited to cervical radiculopathy versus a other neuropathy regards to his paresthesias and numbness right upper   MRI C-spine ordered and reviewed by myself shows some degenerative changes with some fairly significant stenosis around C4 and C5 and C3 and C4.  No abnormal cord signal.  No other acute changes although some degenerative changes noted.  I also reviewed radiology's interpretation and agree with their findings.  MR T-spine shows some degenerative changes but no acute findings and ask spinal cord or bones.  There is evidence of slightly enlarged aortic arch noted by radiology.  I also reviewed the remainder of their interpretation and agree with their  findings.  MRI L-spine shows no abnormal cord signal or evidence of cauda equina or conus medullaris.  There is some edema and evidence of arthropathy at L3 and L4 and L4 and L5.  There is also some disc bulging at these levels resulting in left foraminal stenosis without any other clear acute process.   UA is not suggestive of cystitis and overall have low suspicion for acute infectious process.  Patient states she  is feeling much better after some analgesia as above.  Given he is able to ambulate using walker and feeling better with low suspicion for acute cord compression or other immediate life-threatening process I think she is stable for discharge with continued outpatient PCP and neurosurgery evaluation.  I think it is reasonable for her to be evaluated to see if she is a candidate for steroid injections or additional analgesia options.  Advised of incidental finding of enlarged aorta and recommendation to discuss this with PCP who can coordinate surveillance imaging as indicated.  Discharged in stable condition.  Strict return precautions advised and discussed.   FINAL CLINICAL IMPRESSION(S) / ED DIAGNOSES   Final diagnoses:  Arm paresthesia, right  Acute right-sided low back pain with sciatica, sciatica laterality unspecified  DDD (degenerative disc disease), lumbosacral  Enlarged aorta (HCC)     Rx / DC Orders   ED Discharge Orders     None        Note:  This document was prepared using Dragon voice recognition software and may include unintentional dictation errors.   Gilles ChiquitoSmith, Pranika Finks P, MD 12/16/21 [phone]

## 2021-12-16 NOTE — ED Notes (Signed)
Pt placed on bedpan for urine sample. 

## 2021-12-16 NOTE — ED Notes (Signed)
See triage note  presents with back pain  states she fell about 6 days ago  cont's to have pain  unable to ambulates without pain

## 2021-12-16 NOTE — ED Notes (Signed)
Pt was walked to bathroom by staff for urine sample. Per staff, pt was very unsteady on feet. Pt did not fall. Pt brought back to bed.

## 2021-12-16 NOTE — Discharge Instructions (Addendum)
Your Cervical MRI today showed: 1. Multilevel degenerative changes of the cervical spine, most pronounced at C4-5 where there is moderate-to-severe canal stenosis and moderate bilateral foraminal stenosis. 2. Moderate canal stenosis and mild bilateral foraminal stenosis at C3-4. 3. No cervical cord signal changes.  Your Thoracic MRI today showed: 1. Mild degenerative disc disease of the thoracic spine without significant foraminal or canal stenosis at any level. 2. No acute osseous abnormality or abnormal cord signal. 3. Thoracic aortic arch measures approximately 3.1 cm in diameter. Recommend annual imaging followup by CTA or MRA. This recommendation follows 2010 ACCF/AHA/AATS/ACR/ASA/SCA/SCAI/SIR/STS/SVM Guidelines for the Diagnosis and Management of Patients with Thoracic Aortic Disease. Circulation.2010; 121ML:4928372. Aortic aneurysm NOS (ICD10-I71.9)  Your Lumbar MRI today showed: IMPRESSION: 1. Mild facet arthropathy at L3-L4 and L4-L5 with associated effusions and perifacetal soft tissue edema on the left at L3-L4, which reflect a source of pain. 2. Mild disc bulges at L3-L4 and L4-L5 resulting in up to mild left neural foraminal stenosis at L4-L5. 3. Otherwise, minimal degenerative changes without significant spinal canal or neural foraminal stenosis, and no evidence of nerve root impingement.

## 2021-12-16 NOTE — ED Triage Notes (Signed)
Pt states she got dizzy while getting up out of bed and fell, c/o back pain that has not resolved, was seen here 1/20 for the same

## 2021-12-16 NOTE — ED Notes (Signed)
Pt clarifies that she has not fallen today, she is having continued back pain from fall on 1/20, was evaluated after fall on same day. No new injuries.

## 2021-12-21 DIAGNOSIS — I712 Thoracic aortic aneurysm, without rupture, unspecified: Secondary | ICD-10-CM | POA: Insufficient documentation

## 2022-01-12 ENCOUNTER — Other Ambulatory Visit: Payer: Self-pay | Admitting: Orthopedic Surgery

## 2022-01-12 DIAGNOSIS — M48062 Spinal stenosis, lumbar region with neurogenic claudication: Secondary | ICD-10-CM

## 2022-01-24 ENCOUNTER — Ambulatory Visit
Admission: RE | Admit: 2022-01-24 | Discharge: 2022-01-24 | Disposition: A | Discharge status: Home / Self Care | Payer: No Typology Code available for payment source | Source: Ambulatory Visit | Attending: Orthopedic Surgery | Admitting: Orthopedic Surgery

## 2022-01-24 ENCOUNTER — Other Ambulatory Visit: Payer: No Typology Code available for payment source

## 2022-01-24 DIAGNOSIS — M48062 Spinal stenosis, lumbar region with neurogenic claudication: Secondary | ICD-10-CM

## 2022-01-24 IMAGING — CT CT L SPINE W/ CM
1 of 6 series · 7 of 14 positions shown, 9 images · non-contrast
Comparison: Lumbar MRI [DATE]

CLINICAL DATA: Spinal stenosis, lumbar region with neurogenic
claudication. Right-sided pain.
TECHNIQUE: Contiguous axial images were obtained through the Lumbar spine after
the intrathecal infusion of infusion. Coronal and sagittal
reconstructions were obtained of the axial image sets.

[Series 4: l spine soft · axial · 0.37mm/px · z∈[-1098,-936]mm · 7 of 109 slices shown, 9 images]
[im 14/109  soft-tissue]
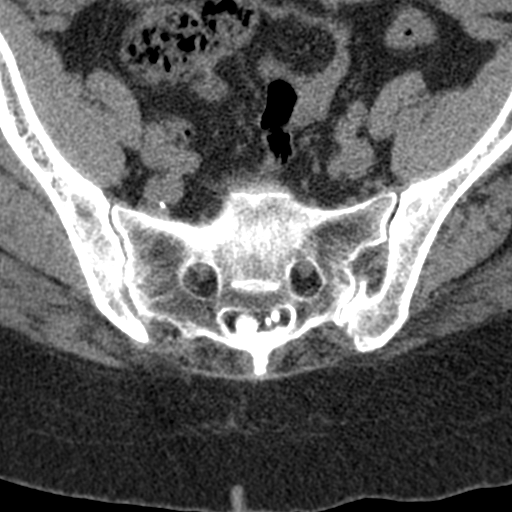
[im 14/109  bone]
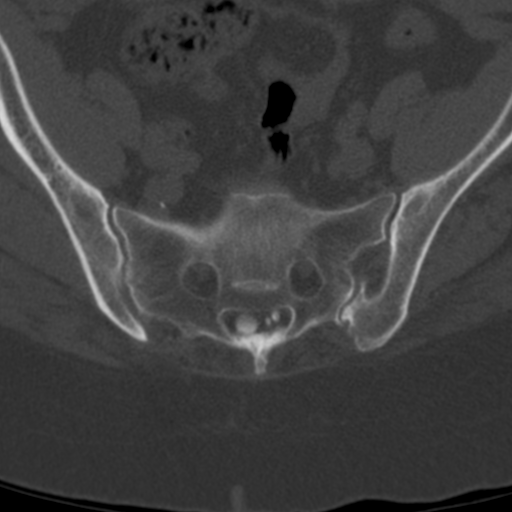
[im 28/109  bone]
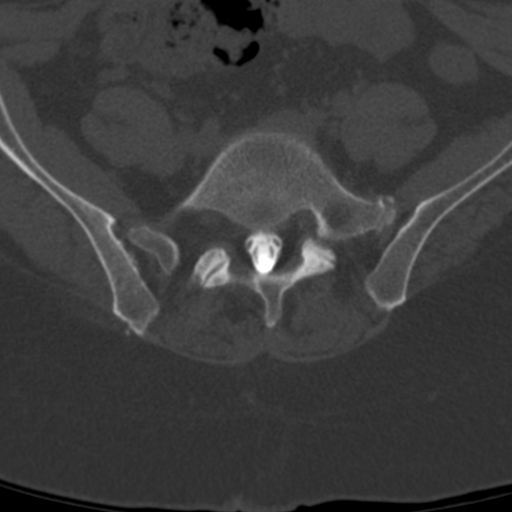
[im 41/109  bone]
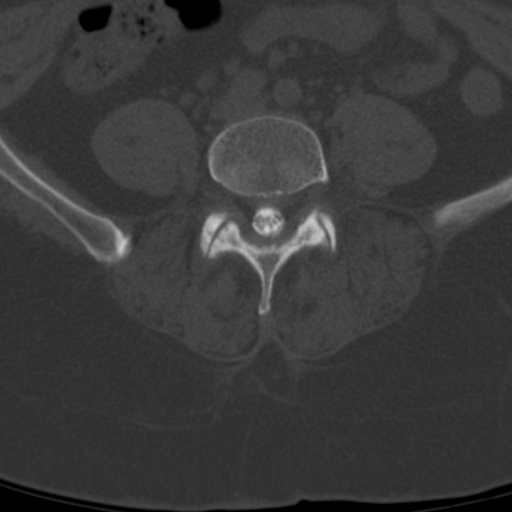
[im 55/109  bone]
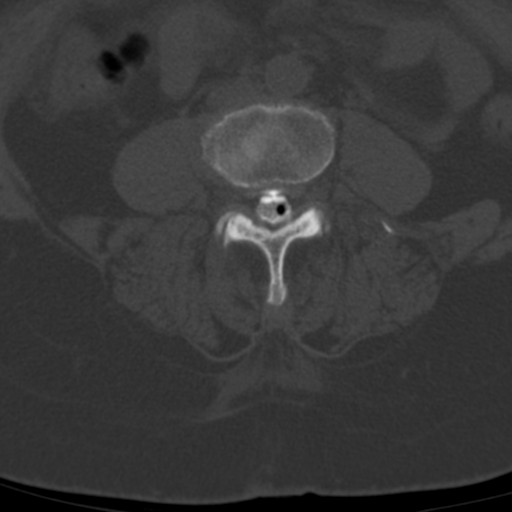
[im 68/109  soft-tissue]
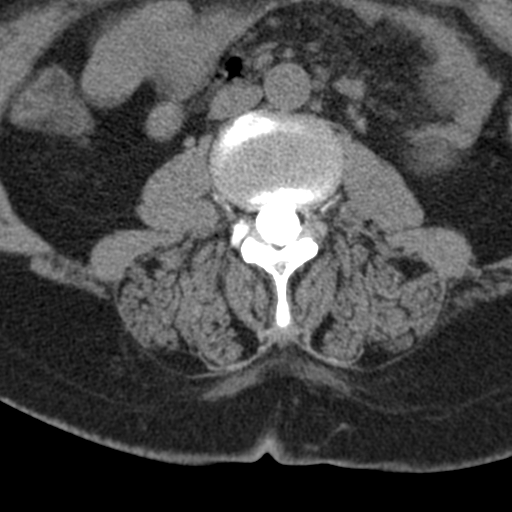
[im 68/109  bone]
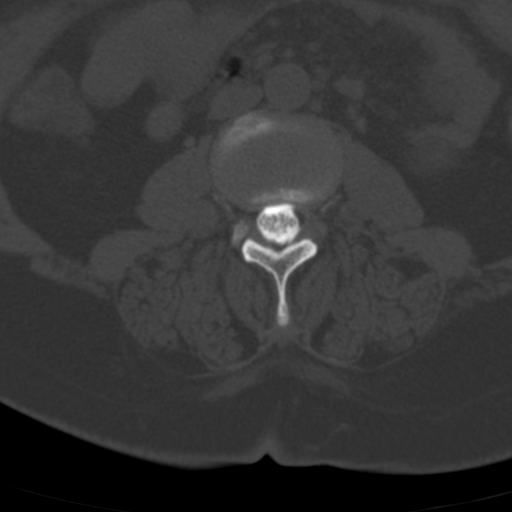
[im 82/109  bone]
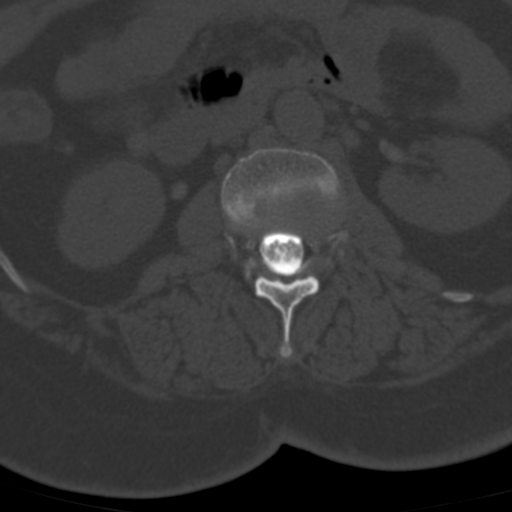
[im 95/109  bone]
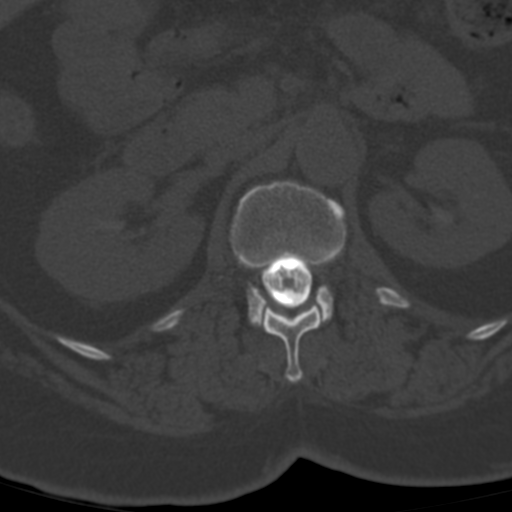

[7 of 14 positions shown; findings below may reference images not displayed]

EXAM:
LUMBAR MYELOGRAM

FLUOROSCOPY:
4 minutes 18 seconds.  566.26 micro gray meter squared

PROCEDURE:
After thorough discussion of risks and benefits of the procedure
including bleeding, infection, injury to nerves, blood vessels,
adjacent structures as well as headache and CSF leak, written and
oral informed consent was obtained. Consent was obtained by Dr. TIGER
TIGER. Time out form was completed.

Patient was positioned prone on the fluoroscopy table. Local
anesthesia was provided with 1% lidocaine without epinephrine after
prepped and draped in the usual sterile fashion. Puncture was
performed at L3-4 using a 5 inch 22-gauge spinal needle via left
paramedian approach. The needle was placed within the thecal sac,
with return of clear CSF. 15 mL of Isovue [NG] was injected into
the thecal sac, with normal opacification of the nerve roots and
cauda equina consistent with free flow within the subarachnoid
space. There are some mixed components to this injection, with some
subdural contrast.

I personally performed the lumbar puncture and administered the
intrathecal contrast. I also personally performed acquisition of the
myelogram images.
FINDINGS: LUMBAR MYELOGRAM FINDINGS:

There are small anterior extradural defects at L1-2 and L2-3 but no
stenosis or abnormal motion.

At L3-4, there is a moderate anterior extradural defect. There is
some rocking motion with flexion extension, possibly with up to 2 mm
of anterolisthesis.

At L4-5, there is a moderate anterior extradural defect. Mild
rocking motion occurs with flexion extension.

The L5-S1 level appears normal.

There is no evidence of focal nerve root compression or central
canal stenosis.

CT LUMBAR MYELOGRAM FINDINGS:

The injection is partially mixed, with subdural and subarachnoid
contrast.

T12-L1 and L1-2: Normal

L2-3: Mild bulging of the disc.  No stenosis.

L3-4: Mild to moderate bulging of the disc. Bilateral facet
degeneration with facet and ligamentous hypertrophy. 1 mm of
anterolisthesis. Mild narrowing of the lateral recesses but without
visible neural compression.

L4-5: Moderate bulging of the disc. Facet degeneration with facet
and ligamentous hypertrophy. Mild canal narrowing but without
evidence of neural compression in the central canal, lateral
recesses or foramina.

L5-S1: Normal appearance of the disc. Minimal facet arthritis. No
stenosis. L5 is a transitional vertebra with a transitional
articulation on the left which shows some degenerative change.

Compared to the MRI, no change is appreciated.
IMPRESSION: L3-4: Bilateral facet arthropathy. Bulging of the disc. 1 mm of
anterolisthesis in the supine position and 1-2 mm of anterolisthesis
with standing flexion extension, with slight rocking motion. No
apparent neural compressive stenosis. The findings at this level
could be a cause of back pain or referred facet syndrome pain. The
facet arthropathy at this level appears more pronounced than at the
L4-5 level.

L4-5: Bilateral facet arthropathy. Bulging of the disc. No apparent
neural compressive stenosis. Slight rocking motion with flexion
extension. The findings at this level could be a cause of back pain
or referred facet syndrome pain.

L5-S1: L5 is a transitional vertebra with a transitional
articulation on the left this shows some degenerative change and
could contribute to left lower back pain. No stenosis.

## 2022-01-24 IMAGING — XA DG MYELOGRAPHY LUMBAR INJ LUMBOSACRAL
13 of 15 series · 13 of 15 positions shown · non-contrast
Comparison: Lumbar MRI [DATE]

CLINICAL DATA: Spinal stenosis, lumbar region with neurogenic
claudication. Right-sided pain.
TECHNIQUE: Contiguous axial images were obtained through the Lumbar spine after
the intrathecal infusion of infusion. Coronal and sagittal
reconstructions were obtained of the axial image sets.

[Series 1: w lumbar spine lat · 0.15mm/px · 1 of 1 slices shown]
[im 1/1]
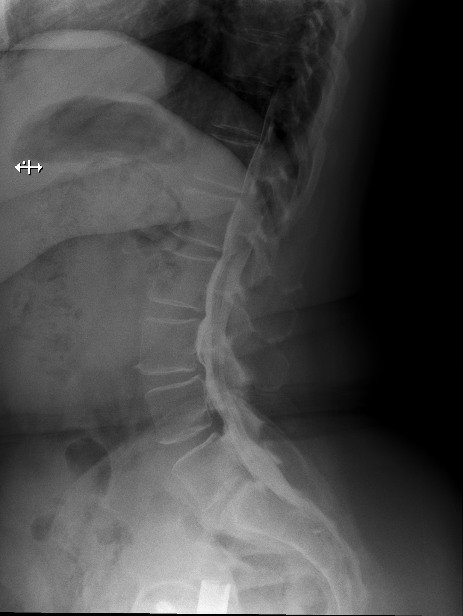

[Series 1: vasc adipose · 1 of 1 slices shown (1 of 11)]
[im 1/1]
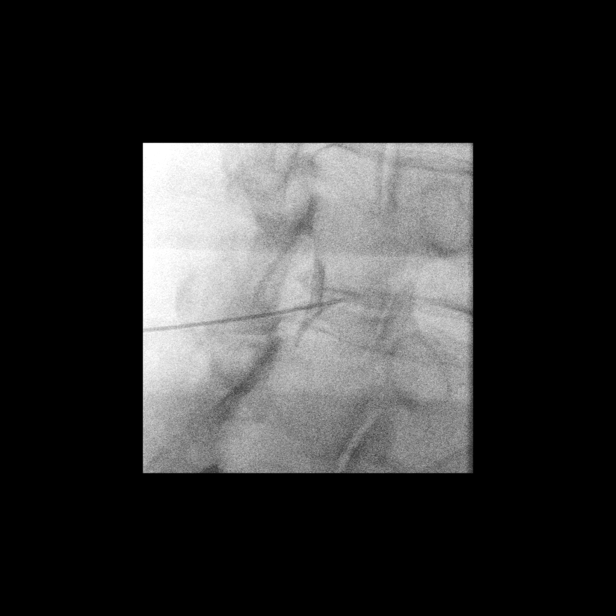

[Series 2: vasc adipose · 1 of 1 slices shown (2 of 11)]
[im 1/1]
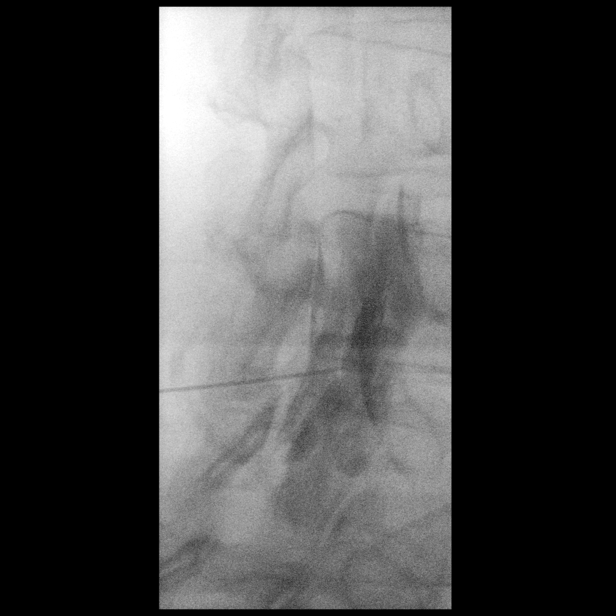

[Series 3: w lumbar spine extension · 0.15mm/px · 1 of 1 slices shown]
[im 1/1]
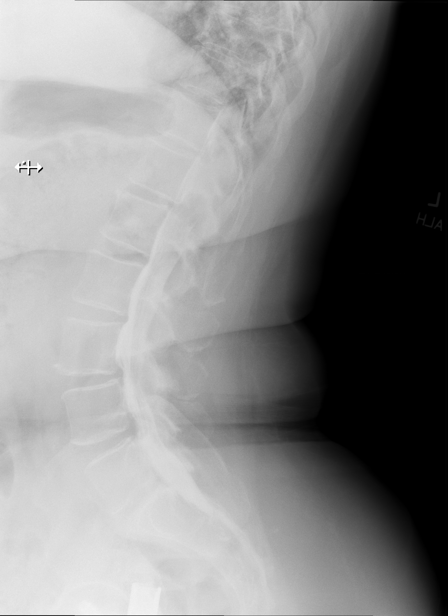

[Series 3: vasc adipose · 1 of 1 slices shown (3 of 11)]
[im 1/1]
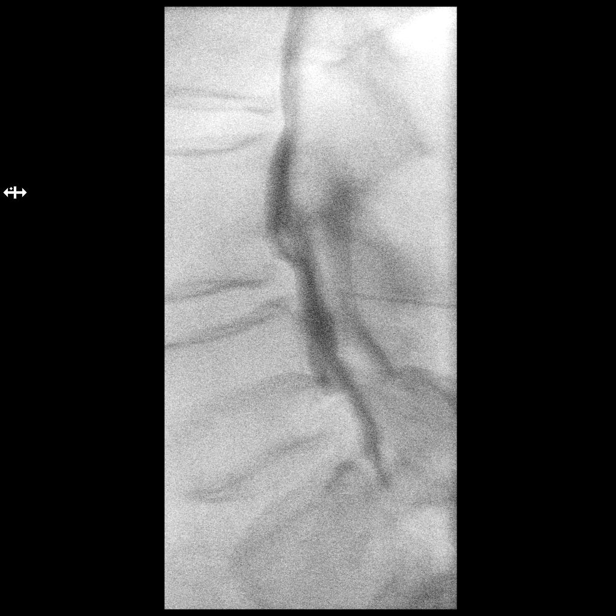

[Series 4: vasc adipose · 1 of 1 slices shown (4 of 11)]
[im 1/1]
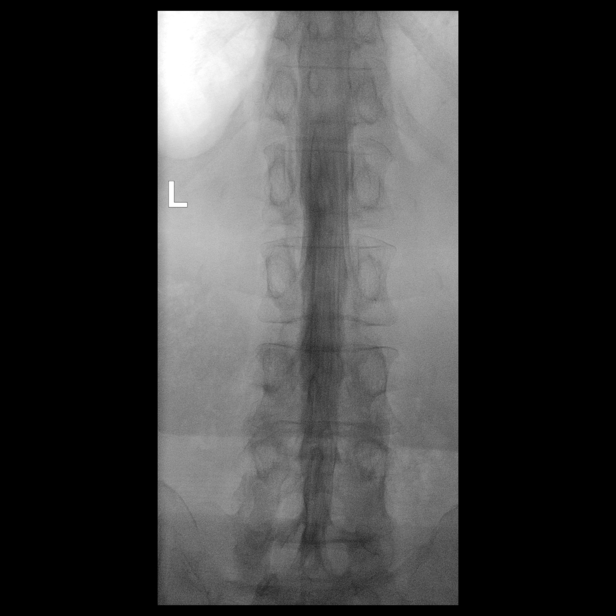

[Series 5: vasc adipose · 1 of 1 slices shown (5 of 11)]
[im 1/1]
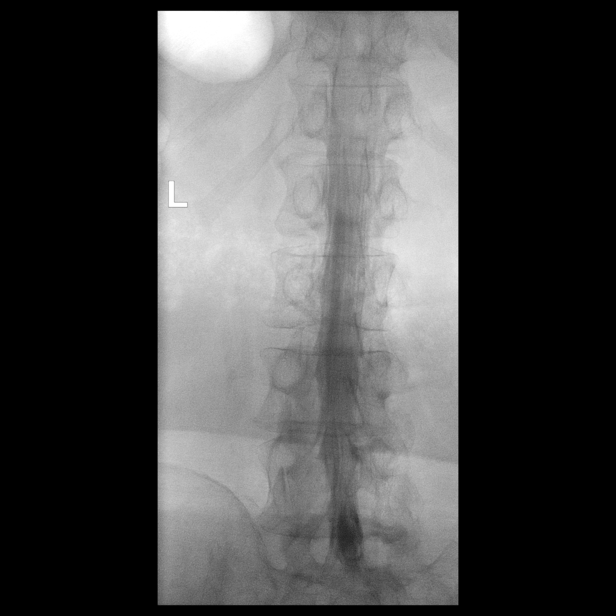

[Series 6: vasc adipose · 1 of 1 slices shown (6 of 11)]
[im 1/1]
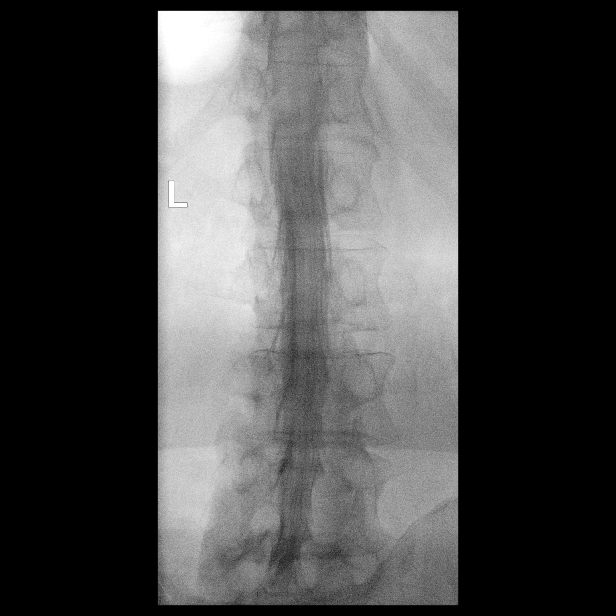

[Series 7: vasc adipose · 1 of 1 slices shown (7 of 11)]
[im 1/1]
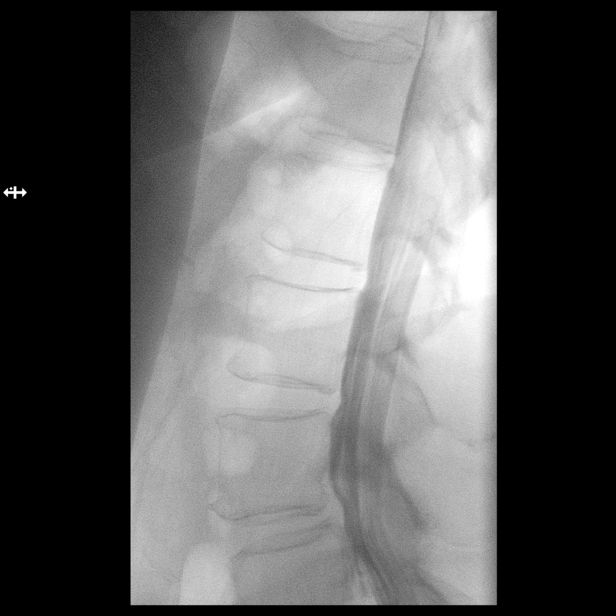

[Series 8: vasc adipose · 1 of 1 slices shown (8 of 11)]
[im 1/1]
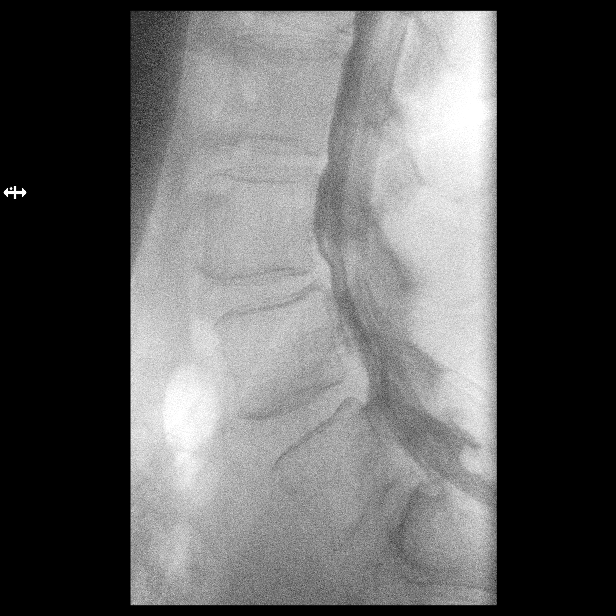

[Series 10: vasc adipose · 1 of 1 slices shown (9 of 11)]
[im 1/1]
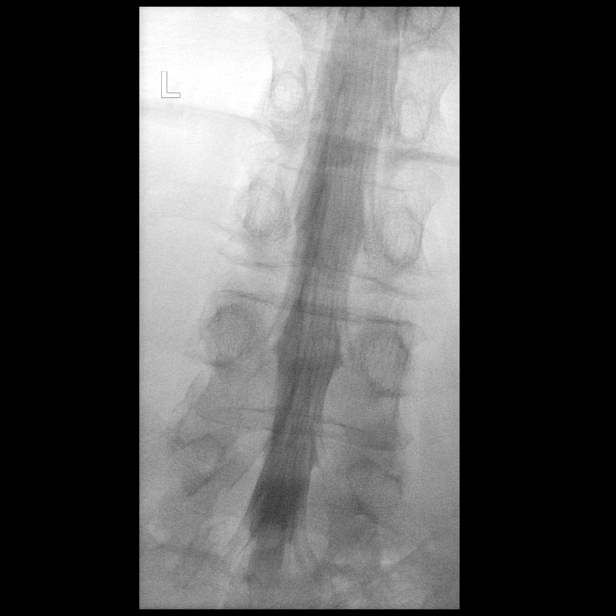

[Series 11: vasc adipose · 1 of 1 slices shown (10 of 11)]
[im 1/1]
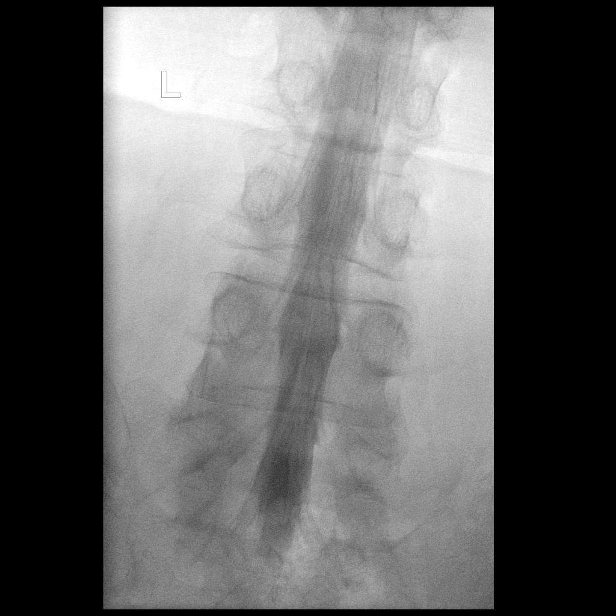

[Series 12: vasc adipose · 1 of 1 slices shown (11 of 11)]
[im 1/1]
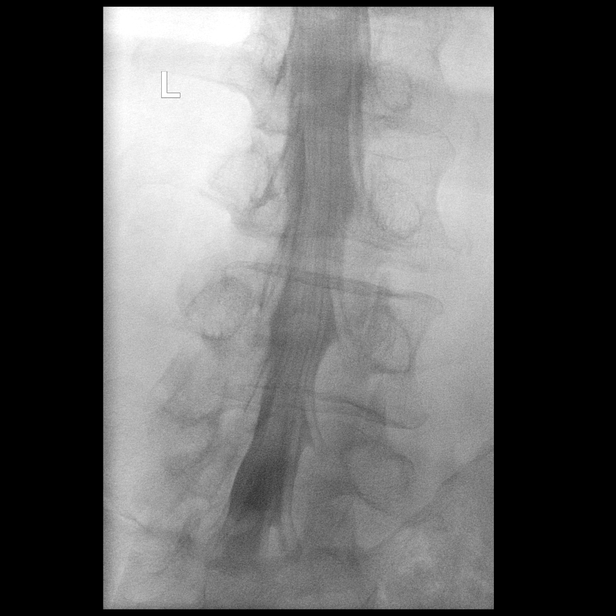

[13 of 15 positions shown; findings below may reference images not displayed]

EXAM:
LUMBAR MYELOGRAM

FLUOROSCOPY:
4 minutes 18 seconds.  566.26 micro gray meter squared

PROCEDURE:
After thorough discussion of risks and benefits of the procedure
including bleeding, infection, injury to nerves, blood vessels,
adjacent structures as well as headache and CSF leak, written and
oral informed consent was obtained. Consent was obtained by Dr. TIGER
TIGER. Time out form was completed.

Patient was positioned prone on the fluoroscopy table. Local
anesthesia was provided with 1% lidocaine without epinephrine after
prepped and draped in the usual sterile fashion. Puncture was
performed at L3-4 using a 5 inch 22-gauge spinal needle via left
paramedian approach. The needle was placed within the thecal sac,
with return of clear CSF. 15 mL of Isovue [NG] was injected into
the thecal sac, with normal opacification of the nerve roots and
cauda equina consistent with free flow within the subarachnoid
space. There are some mixed components to this injection, with some
subdural contrast.

I personally performed the lumbar puncture and administered the
intrathecal contrast. I also personally performed acquisition of the
myelogram images.
FINDINGS: LUMBAR MYELOGRAM FINDINGS:

There are small anterior extradural defects at L1-2 and L2-3 but no
stenosis or abnormal motion.

At L3-4, there is a moderate anterior extradural defect. There is
some rocking motion with flexion extension, possibly with up to 2 mm
of anterolisthesis.

At L4-5, there is a moderate anterior extradural defect. Mild
rocking motion occurs with flexion extension.

The L5-S1 level appears normal.

There is no evidence of focal nerve root compression or central
canal stenosis.

CT LUMBAR MYELOGRAM FINDINGS:

The injection is partially mixed, with subdural and subarachnoid
contrast.

T12-L1 and L1-2: Normal

L2-3: Mild bulging of the disc.  No stenosis.

L3-4: Mild to moderate bulging of the disc. Bilateral facet
degeneration with facet and ligamentous hypertrophy. 1 mm of
anterolisthesis. Mild narrowing of the lateral recesses but without
visible neural compression.

L4-5: Moderate bulging of the disc. Facet degeneration with facet
and ligamentous hypertrophy. Mild canal narrowing but without
evidence of neural compression in the central canal, lateral
recesses or foramina.

L5-S1: Normal appearance of the disc. Minimal facet arthritis. No
stenosis. L5 is a transitional vertebra with a transitional
articulation on the left which shows some degenerative change.

Compared to the MRI, no change is appreciated.
IMPRESSION: L3-4: Bilateral facet arthropathy. Bulging of the disc. 1 mm of
anterolisthesis in the supine position and 1-2 mm of anterolisthesis
with standing flexion extension, with slight rocking motion. No
apparent neural compressive stenosis. The findings at this level
could be a cause of back pain or referred facet syndrome pain. The
facet arthropathy at this level appears more pronounced than at the
L4-5 level.

L4-5: Bilateral facet arthropathy. Bulging of the disc. No apparent
neural compressive stenosis. Slight rocking motion with flexion
extension. The findings at this level could be a cause of back pain
or referred facet syndrome pain.

L5-S1: L5 is a transitional vertebra with a transitional
articulation on the left this shows some degenerative change and
could contribute to left lower back pain. No stenosis.

## 2022-01-24 MED ORDER — IOPAMIDOL (ISOVUE-M 200) INJECTION 41%
15.0000 mL | Freq: Once | INTRAMUSCULAR | Status: AC
  Administered 2022-01-24: 15 mL via INTRATHECAL

## 2022-01-24 MED ORDER — ONDANSETRON HCL 4 MG/2ML IJ SOLN: 4.0000 mg | Freq: Once | INTRAMUSCULAR | Status: DC | PRN

## 2022-01-24 MED ORDER — MEPERIDINE HCL 50 MG/ML IJ SOLN: 50.0000 mg | Freq: Once | INTRAMUSCULAR | Status: DC | PRN

## 2022-01-24 MED ORDER — DIAZEPAM 5 MG PO TABS
10.0000 mg | ORAL_TABLET | Freq: Once | ORAL | Status: AC
  Administered 2022-01-24: 5 mg via ORAL

## 2022-01-24 NOTE — Discharge Instructions (Signed)

## 2022-02-02 ENCOUNTER — Emergency Department
Admission: EM | Admit: 2022-02-02 | Discharge: 2022-02-03 | Disposition: A | Discharge status: Home / Self Care | Payer: No Typology Code available for payment source | Attending: Emergency Medicine | Admitting: Emergency Medicine

## 2022-02-02 ENCOUNTER — Encounter: Payer: Self-pay | Admitting: Emergency Medicine

## 2022-02-02 ENCOUNTER — Emergency Department: Payer: No Typology Code available for payment source

## 2022-02-02 ENCOUNTER — Other Ambulatory Visit: Payer: Self-pay

## 2022-02-02 DIAGNOSIS — M545 Low back pain, unspecified: Secondary | ICD-10-CM | POA: Insufficient documentation

## 2022-02-02 DIAGNOSIS — R159 Full incontinence of feces: Secondary | ICD-10-CM | POA: Diagnosis not present

## 2022-02-02 LAB — COMPREHENSIVE METABOLIC PANEL
ALT: 11 U/L (ref 0–44)
AST: 14 U/L — ABNORMAL LOW (ref 15–41)
Albumin: 3.5 g/dL (ref 3.5–5.0)
Alkaline Phosphatase: 68 U/L (ref 38–126)
Anion gap: 6 (ref 5–15)
BUN: 20 mg/dL (ref 6–20)
CO2: 24 mmol/L (ref 22–32)
Calcium: 8.9 mg/dL (ref 8.9–10.3)
Chloride: 108 mmol/L (ref 98–111)
Creatinine, Ser: 0.86 mg/dL (ref 0.44–1.00)
GFR, Estimated: >60 mL/min (ref >60)
Glucose, Bld: 98 mg/dL (ref 70–99)
Potassium: 3.9 mmol/L (ref 3.5–5.1)
Sodium: 138 mmol/L (ref 135–145)
Total Bilirubin: 0.4 mg/dL (ref 0.3–1.2)
Total Protein: 7.6 g/dL (ref 6.5–8.1)

## 2022-02-02 LAB — CBC
HCT: 39.5 % (ref 36.0–46.0)
Hemoglobin: 12.2 g/dL (ref 12.0–15.0)
MCH: 27.7 pg (ref 26.0–34.0)
MCHC: 30.9 g/dL (ref 30.0–36.0)
MCV: 89.6 fL (ref 80.0–100.0)
Platelets: 339 10*3/uL (ref 150–400)
RBC: 4.41 MIL/uL (ref 3.87–5.11)
RDW: 12.5 % (ref 11.5–15.5)
WBC: 8.8 10*3/uL (ref 4.0–10.5)
nRBC: 0 % (ref 0.0–0.2)

## 2022-02-02 LAB — TROPONIN I (HIGH SENSITIVITY)
Troponin I (High Sensitivity): 4 ng/L (ref <18)
Troponin I (High Sensitivity): 4 ng/L (ref <18)

## 2022-02-02 LAB — LIPASE, BLOOD: Lipase: 47 U/L (ref 11–51)

## 2022-02-02 IMAGING — MR MR LUMBAR SPINE W/O CM
5 series · 31 of 48 positions shown · non-contrast
Comparison: [DATE]

CLINICAL DATA: Stool incontinence, back pain, urinary incontinence

EXAM:
MRI LUMBAR SPINE WITHOUT CONTRAST
TECHNIQUE: Multiplanar, multisequence MR imaging of the lumbar spine was
performed. No intravenous contrast was administered.

[Series 5: T2 · sagittal · 4.0mm · 0.81mm/px · 6 of 17 slices shown (1 of 2)]
[im 1/17]
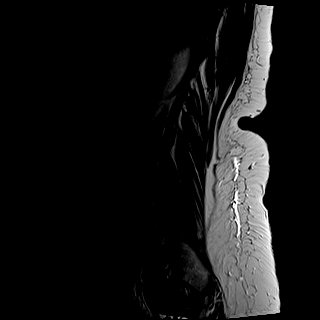
[im 4/17]
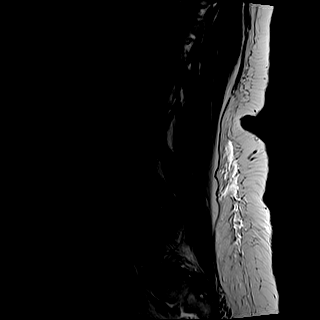
[im 7/17]
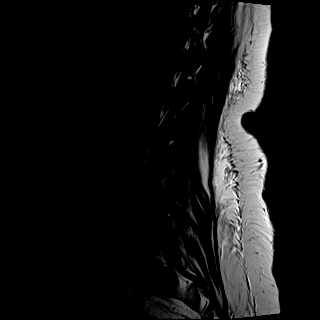
[im 10/17]
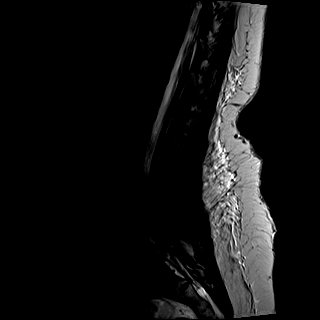
[im 13/17]
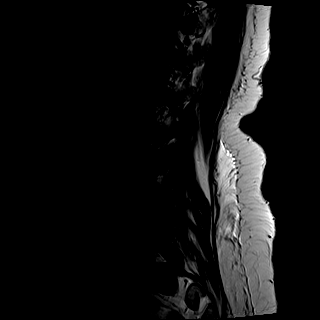
[im 17/17]
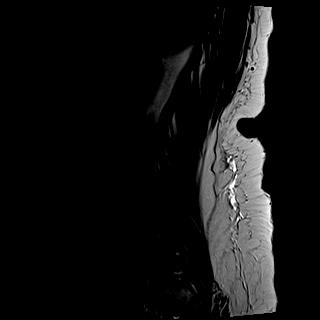

[Series 6: T1 · sagittal · 4.0mm · 0.81mm/px · 7 of 17 slices shown (1 of 2)]
[im 1/17]
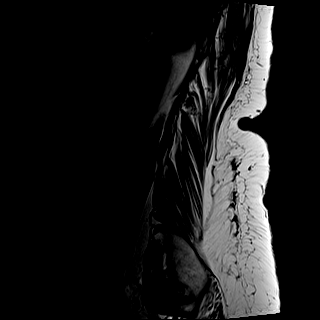
[im 3/17]
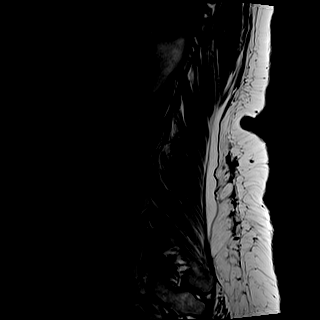
[im 6/17]
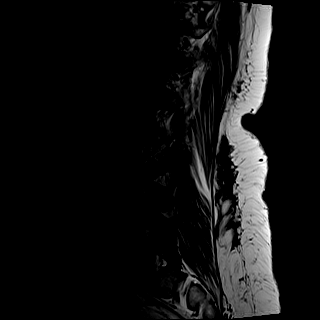
[im 9/17]
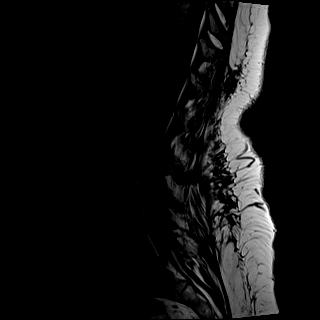
[im 11/17]
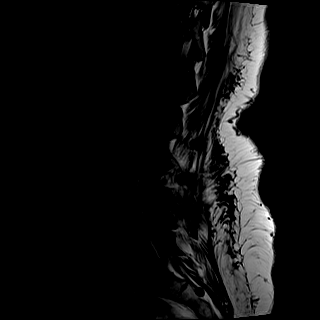
[im 14/17]
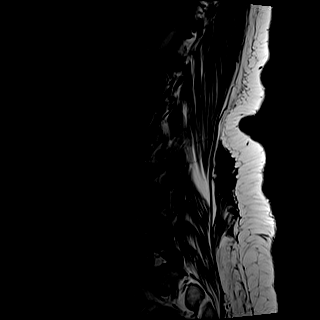
[im 17/17]
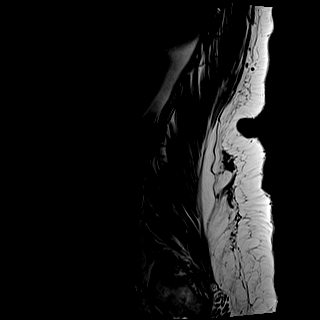

[Series 7: STIR · sagittal · 4.0mm · 0.41mm/px · 2 of 17 slices shown]
[im 1/17]
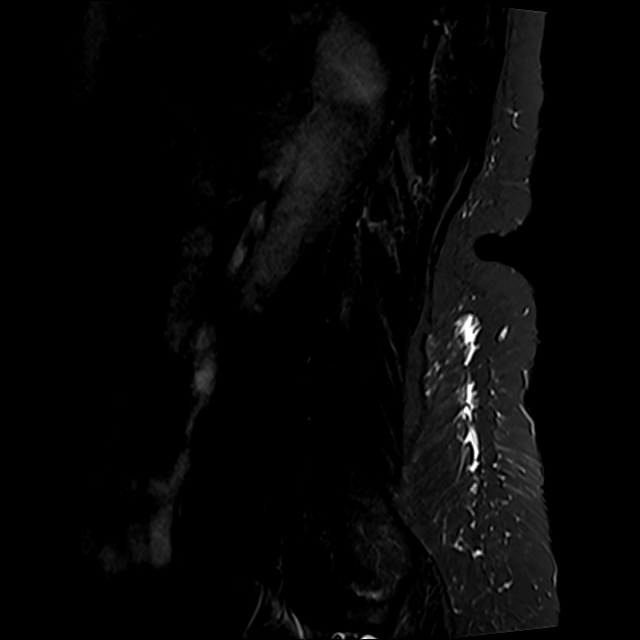
[im 3/17]
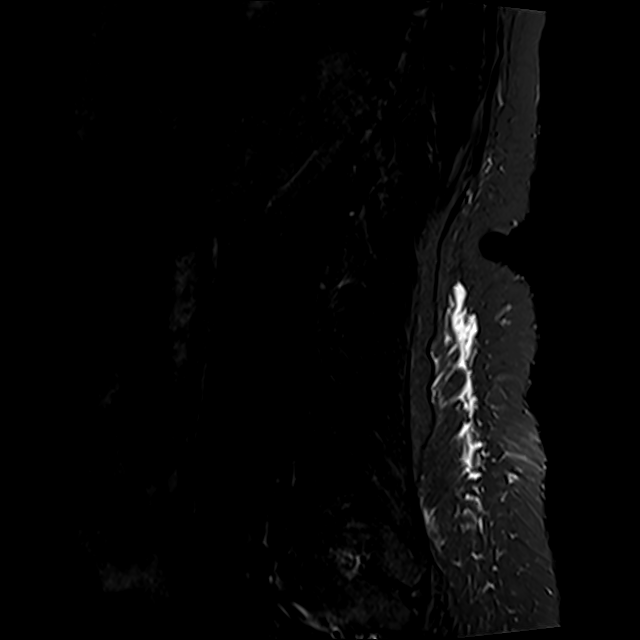

[Series 8: T2 · axial · 4.0mm · 0.78mm/px · z∈[-157,+76]mm · 8 of 36 slices shown (2 of 2)]
[im 1/36]
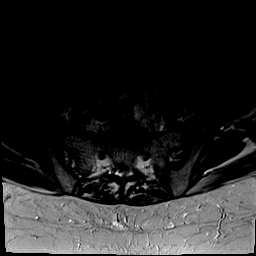
[im 6/36]
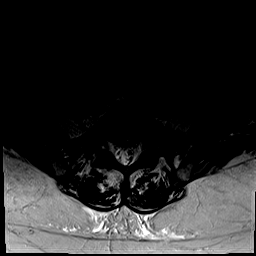
[im 11/36]
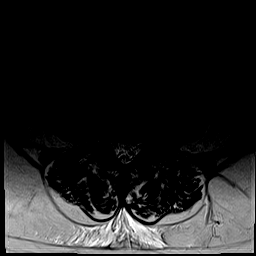
[im 17/36]
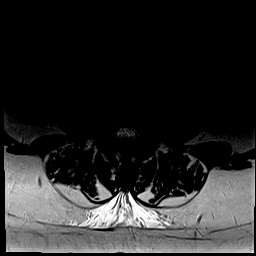
[im 19/36]
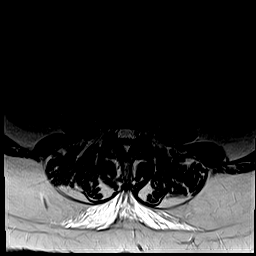
[im 25/36]
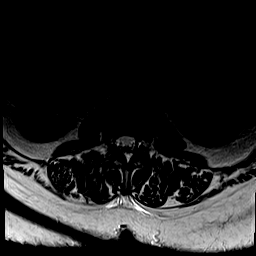
[im 30/36]
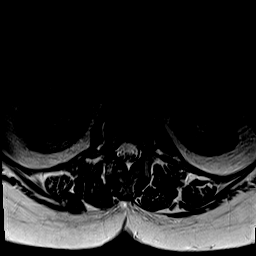
[im 36/36]
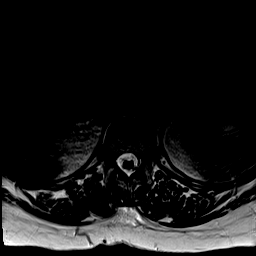

[Series 9: T1 · axial · 4.0mm · 0.39mm/px · z∈[-157,+76]mm · 8 of 36 slices shown (2 of 2)]
[im 1/36]
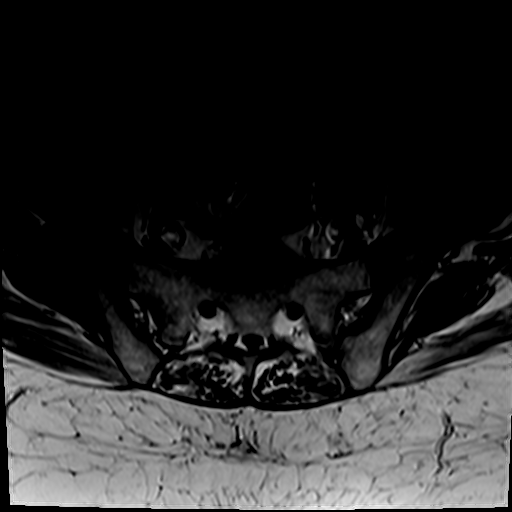
[im 6/36]
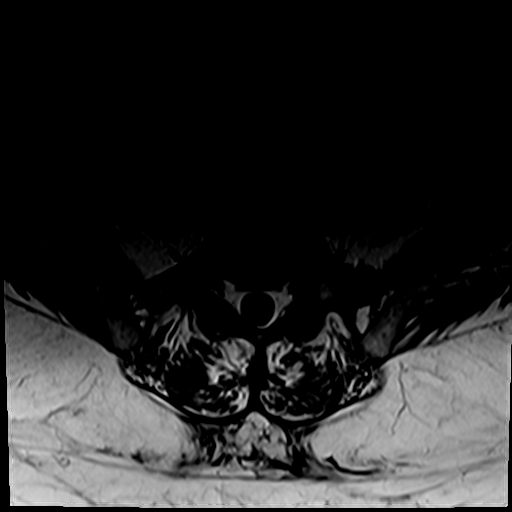
[im 11/36]
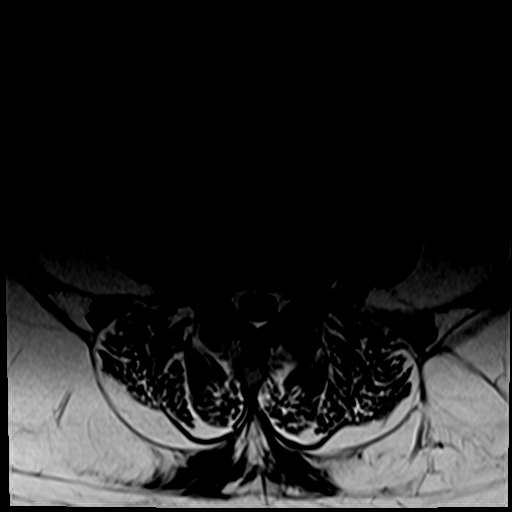
[im 17/36]
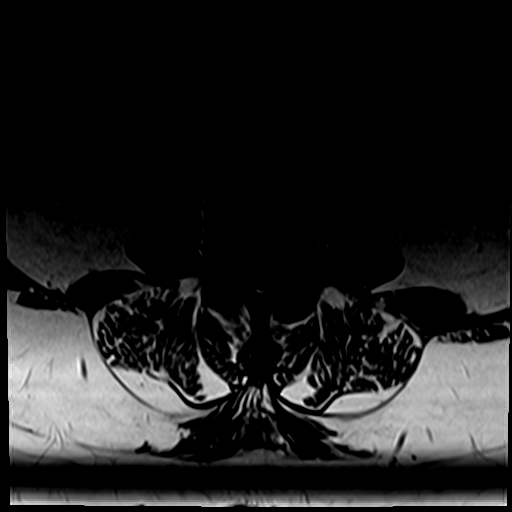
[im 19/36]
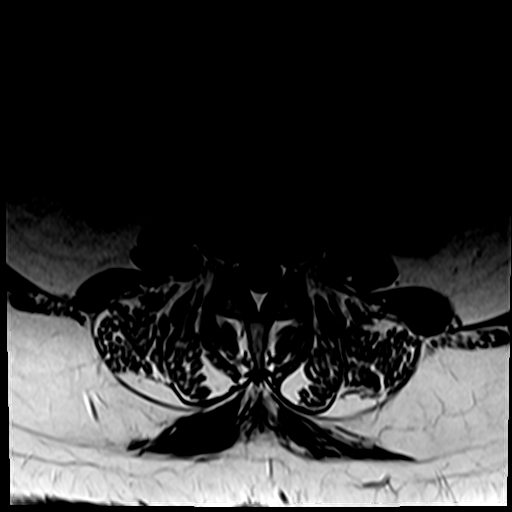
[im 25/36]
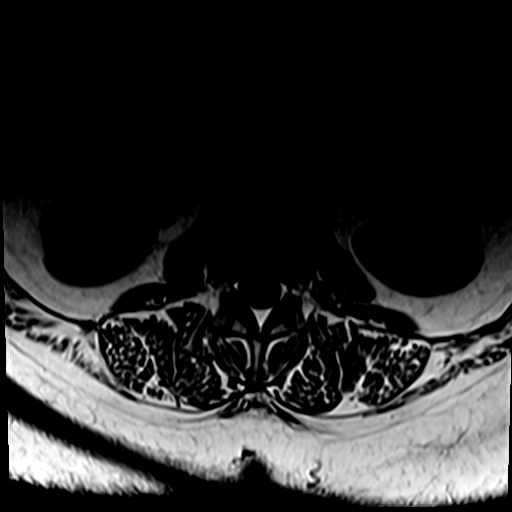
[im 30/36]
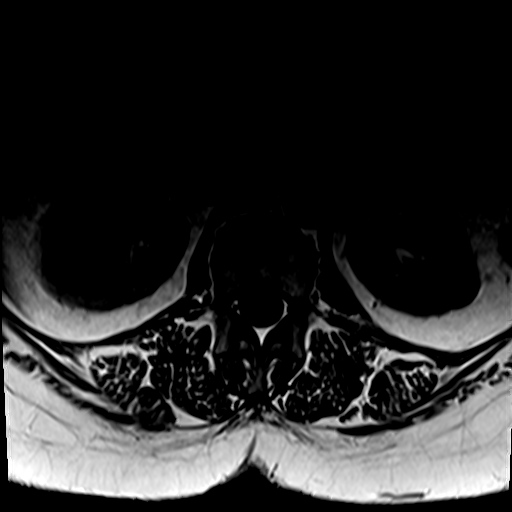
[im 36/36]
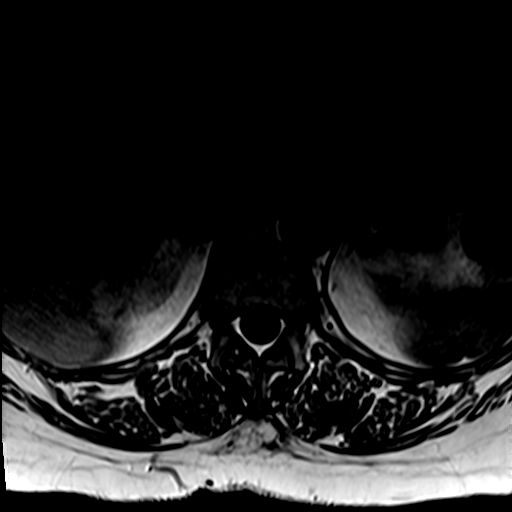

[31 of 48 positions shown; findings below may reference images not displayed]

FINDINGS: Segmentation: Partial sacralization of L5 with a left L5-S1
pseudoarticulation of the broadened L5 transverse process with the
sacral ala.

Alignment:  No listhesis.

Vertebrae:  No fracture, evidence of discitis, or bone lesion.

Conus medullaris and cauda equina: Conus extends to the L1 level.
Conus and cauda equina appear normal.

Paraspinal and other soft tissues: Fatty atrophy of the inferior
paraspinous musculature.

Disc levels:

T12-L1: No significant disc bulge. No spinal canal stenosis or
neural foraminal narrowing.

L1-L2: No significant disc bulge. No spinal canal stenosis or neural
foraminal narrowing.

L2-L3: Mild central disc protrusion. Mild facet arthropathy. No
spinal canal stenosis or neural foraminal narrowing.

L3-L4: Mild disc bulge. Moderate facet arthropathy. No spinal canal
stenosis. Mild left neural foraminal narrowing, unchanged.

L4-L5: Disc bulge with superimposed central protrusion. Mild facet
arthropathy. No spinal canal stenosis. Mild left neural foraminal
narrowing.

L5-S1: No significant disc bulge. No spinal canal stenosis or neural
foraminal narrowing.
IMPRESSION: 1. No spinal canal stenosis.
2. Multilevel facet arthropathy, worst at L3-L4, with mild left
neural foraminal narrowing at L3-L4 and L4-L5.
3. Partial sacralization of L5, with a left L5-S1 pseudoarticulation
of the broadened L5 transverse process with the sacral ala, which
can be a cause of pain.

## 2022-02-02 IMAGING — CR DG CHEST 2V
2 series · 2 of 2 positions shown · non-contrast
Comparison: Thoracic spine MRI [DATE]

CLINICAL DATA: Chest pain.  Shortness of breath.

EXAM:
CHEST - 2 VIEW

[chest pa]
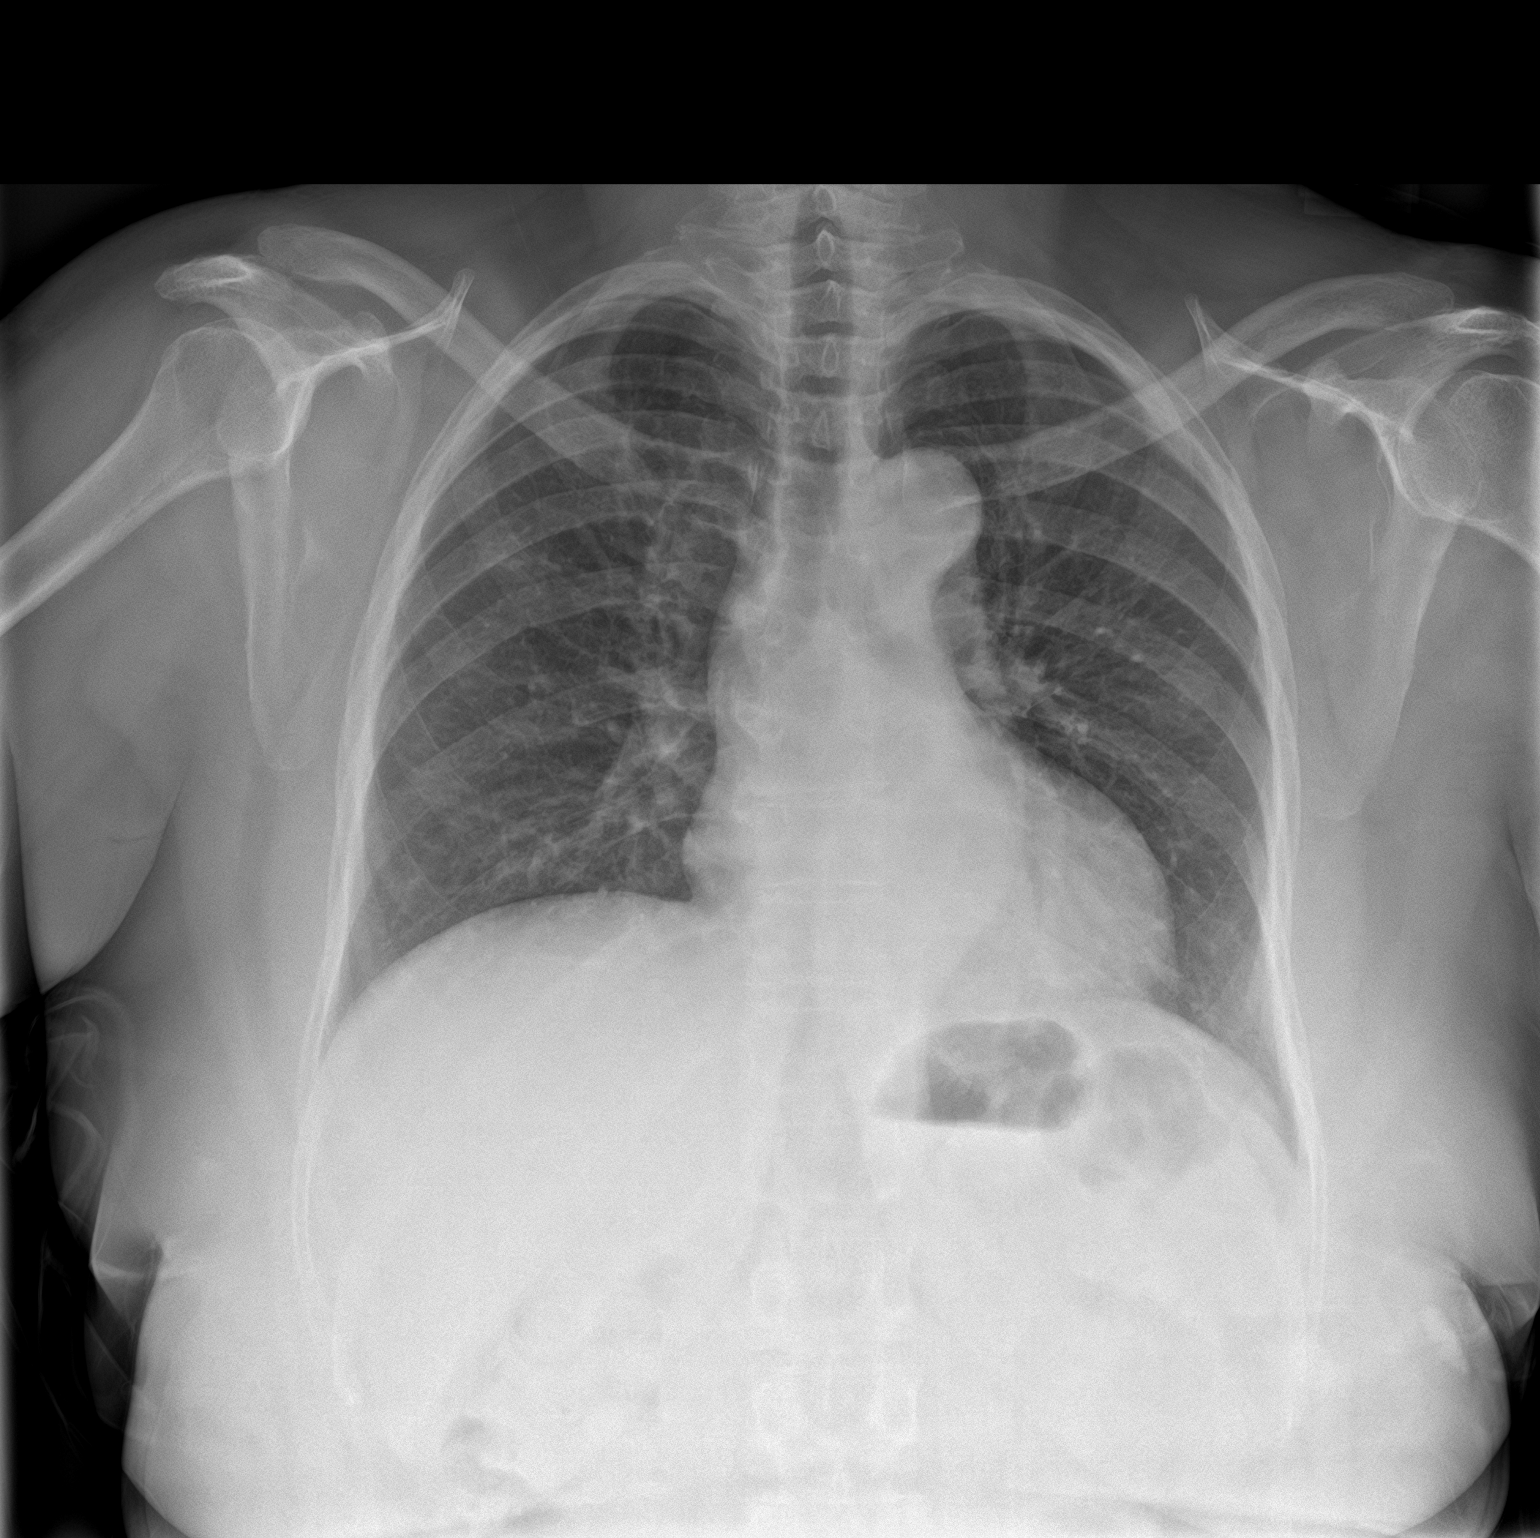

[chest lat]
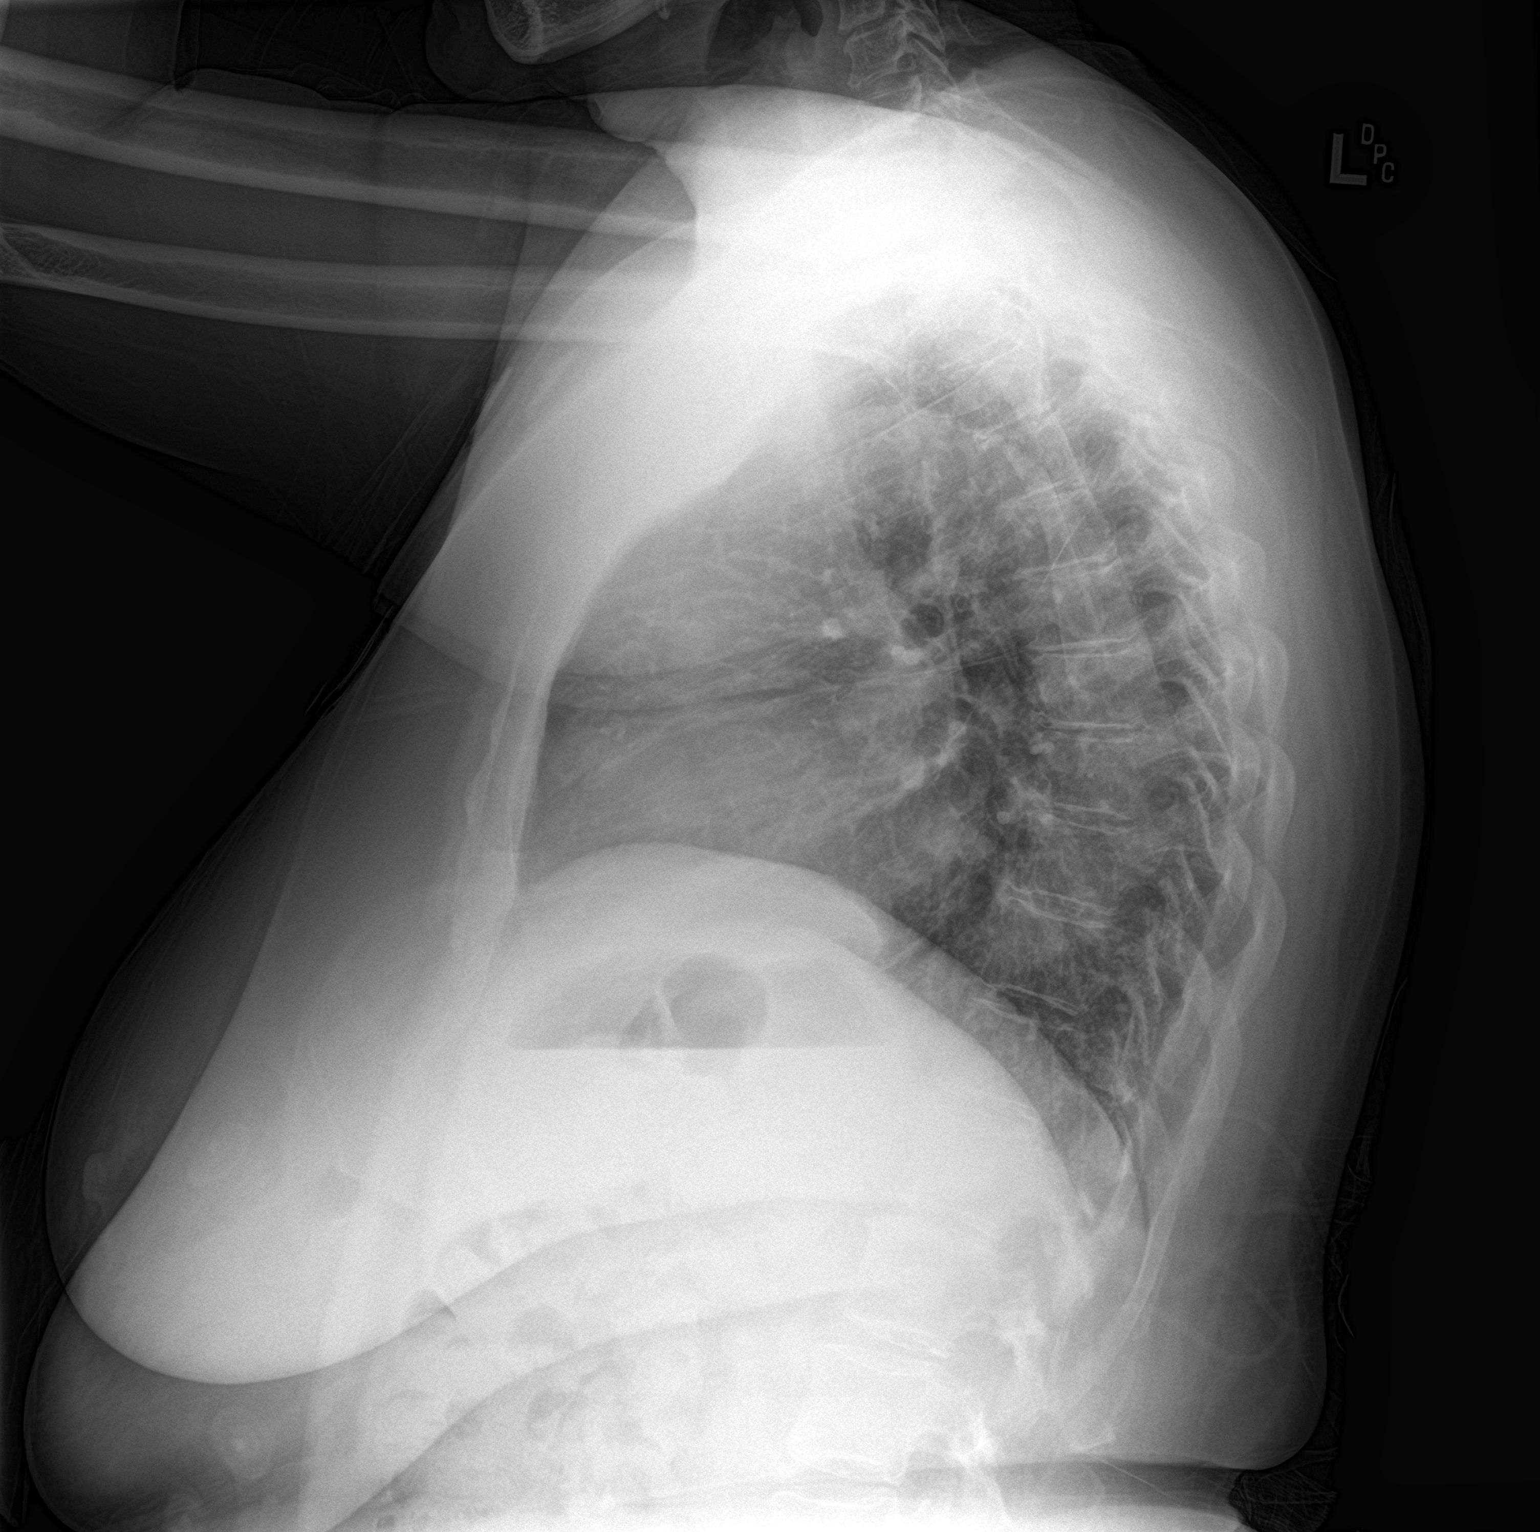

[2 of 2 positions shown; findings below may reference images not displayed]

FINDINGS: Ill-defined 1.8 cm nodular density projects over the left mid chest.
Cannot exclude intrapulmonary nodule.

The lungs appear otherwise clear. Cardiac and mediastinal margins
appear normal. No blunting of the costophrenic angles. Mild thoracic
spondylosis.
IMPRESSION: Possible 1.8 cm pulmonary nodule in the left mid lung. Chest CT is
recommended for further assessment and characterization.

## 2022-02-02 NOTE — ED Triage Notes (Signed)
Pt to ED from home c/o urinary incontinence for "a while" but has gotten worse last couple days as well as stool incontinence.  States yesterday had RUQ abd pain and right flank pain.  Denies urinary pain or foul odor or discharge.  Changes pad about 3 times a day.  States this morning had chest pain and SOB.  Pt A&Ox4, chest rise even and unlabored, skin WNL and in NAD at this time. ?

## 2022-02-02 NOTE — ED Provider Notes (Signed)
? ?[facility] ?Provider Note ? ? ? Event Date/Time  ? First MD Initiated Contact with Patient 02/02/22 2147   ?  (approximate) ? ? ?History  ? ?Fecal incontinence ? ? ?HPI ? ?Valerie Hopkins is a 61 y.o. female  who, per [employer] note dated 01/25/22 was seen for acute exacerbation of chronic low back pain, who presents to the emergency department today because of concern for stool incontinence.  Patient states that she has had issues with urinary incontinence for a little while.  However patient has now had an episode of stool incontinence.  She states she did not feel any need to defecate.  Patient does have history of some chronic back pain issues.  She does continue to complain of low back pain. ?  ? ? ?Physical Exam  ? ?Triage Vital Signs: ?ED Triage Vitals  ?Enc Vitals Group  ?   BP 02/02/22 1931 (!) 169/109  ?   Pulse Rate 02/02/22 1931 80  ?   Resp 02/02/22 1931 16  ?   Temp 02/02/22 1931 98.2 ?F (36.8 ?C)  ?   Temp Source 02/02/22 1931 Oral  ?   SpO2 02/02/22 1931 100 %  ?   Weight 02/02/22 1932 192 lb (87.1 kg)  ?   Height 02/02/22 1932 5\' 2"  (1.575 m)  ?   Head Circumference --   ?   Peak Flow --   ?   Pain Score 02/02/22 1932 5  ?   Pain Loc --   ?   Pain Edu? --   ?   Excl. in GC? --   ? ? ?Most recent vital signs: ?Vitals:  ? 02/02/22 1931  ?BP: (!) 169/109  ?Pulse: 80  ?Resp: 16  ?Temp: 98.2 ?F (36.8 ?C)  ?SpO2: 100%  ? ? ?General: Awake, no distress.  ?CV:  Good peripheral perfusion.  ?Resp:  Normal effort.  ?Abd:  No distention.  ?MSK:  Mild tenderness to lower spine. ? ? ?ED Results / Procedures / Treatments  ? ?Labs ?(all labs ordered are listed, but only abnormal results are displayed) ?Labs Reviewed  ?COMPREHENSIVE METABOLIC PANEL - Abnormal; Notable for the following components:  ?    Result Value  ? AST 14 (*)   ? All other components within normal limits  ?CBC  ?LIPASE, BLOOD  ?TROPONIN I (HIGH SENSITIVITY)  ?TROPONIN I (HIGH SENSITIVITY)  ? ? ? ?EKG ? ?I03/17/23, attending physician, personally viewed and interpreted this EKG ? ?EKG Time: 1944 ?Rate: 70 ?Rhythm: normal sinus rhythm ?Axis: normal ?Intervals: qtc 423 ?QRS: narrow ?ST changes: no st elevation ?Impression: normal ekg ? ? ?RADIOLOGY ?I independently interpreted and visualized the cxr. My interpretation: No pneumonia. No pneumothorax. ?Radiology interpretation:  ?IMPRESSION:  ?Possible 1.8 cm pulmonary nodule in the left mid lung. Chest CT is  ?recommended for further assessment and characterization.  ? ? ?PROCEDURES: ? ?Critical Care performed: No ? ?Procedures ? ? ?MEDICATIONS ORDERED IN ED: ?Medications - No data to display ? ? ?IMPRESSION / MDM / ASSESSMENT AND PLAN / ED COURSE  ?I reviewed the triage vital signs and the nursing notes. ?             ?               ? ?Differential diagnosis includes, but is not limited to, cauda equina, diarrhea, trauma. ? ?Patient presents to the emergency department today because of concerns for stool incontinence.  Patient states  she has had urinary incontinence for quite some time.  She does have a history of some chronic back pain.  At this time given concern for possible cauda equina will obtain MRI. ? ?Additionally CXR that was ordered from triage was concerning for pulmonary nodule. Discussed this with patient and need for CT follow up. ? ?FINAL CLINICAL IMPRESSION(S) / ED DIAGNOSES  ? ?Fecal incontinence ? ? ? ?Note:  This document was prepared using Dragon voice recognition software and may include unintentional dictation errors. ? ?  ?Phineas Semen, MD ?02/02/22 2241 ? ?

## 2022-02-03 DIAGNOSIS — R159 Full incontinence of feces: Secondary | ICD-10-CM | POA: Diagnosis not present

## 2022-02-03 MED ORDER — METHOCARBAMOL 500 MG PO TABS
500.0000 mg | ORAL_TABLET | Freq: Once | ORAL | Status: AC
  Administered 2022-02-03: 500 mg via ORAL
  Filled 2022-02-03 (×2): qty 1

## 2022-02-03 NOTE — Discharge Instructions (Signed)
You have been seen in the Emergency Department (ED)  today for back pain.  Back pain has many possible causes some are related to muscles while others have more serious causes. Even though you were checked carefully today and your exam and evaluation were reassuring, problems may develop later or continue to unfold. Therefore it is imperative that you follow up with doctor closely for further evaluation. ? ?Follow-up with your doctor in 1 day for further evaluation. ? ?For pain control take: robaxin as prescribed ? ?When should you call for help?  ?Call your doctor now or seek immediate medical care if:  ?You have new or worsening numbness in your legs.  ?You have new or worsening weakness in your legs. (This could make it hard to stand up.)  ?You lose control of your bladder or bowels or if you are unable to urinate. ?You have numbness of your groin or buttock region ?If you develop a fever ? ?Watch closely for changes in your health, and be sure to contact your doctor if:  ?Your pain gets worse.  ?You are not getting better after 2 weeks. ? ?How can you care for yourself at home?  ?Take pain medicines exactly as directed.  ?If the doctor gave you a prescription medicine for pain, take it as prescribed.  ?If you are not taking a prescription pain medicine, ask your doctor if you can take an over-the-counter medicine like tylenol or ibuprofen. ?Sit or lie in positions that are most comfortable and reduce your pain. Try one of these positions when you lie down:  ?Lie on your back with your knees bent and supported by large pillows.  ?Lie on the floor with your legs on the seat of a sofa or chair.  ?Lie on your side with your knees and hips bent and a pillow between your legs.  ?Lie on your stomach if it does not make pain worse. ?Do not sit up in bed, and avoid soft couches and twisted positions. Bed rest can help relieve pain at first, but it delays healing. Avoid bed rest after the first day of back pain.  ?Change  positions every 30 minutes. If you must sit for long periods of time, take breaks from sitting. Get up and walk around, or lie in a comfortable position.  ?Try using a heating pad on a low or medium setting for 15 to 20 minutes every 2 or 3 hours. Try a warm shower in place of one session with the heating pad.  ?You can also try an ice pack for 10 to 15 minutes every 2 to 3 hours. Put a thin cloth between the ice pack and your skin.  ?Take short walks several times a day. You can start with 5 to 10 minutes, 3 or 4 times a day, and work up to longer walks. Walk on level surfaces and avoid hills and stairs until your back is better.  ?Return to work and other activities as soon as you can. Continued rest without activity is usually not good for your back.  ?To prevent future back pain, do exercises to stretch and strengthen your back and stomach. Learn how to use good posture, safe lifting techniques, and proper body mechanics. ? ?

## 2022-02-03 NOTE — ED Provider Notes (Signed)
I was asked by Dr. Archie Balboa to f/u the MRI to rule out cauda equina.  MRI is negative for any spinal stenosis.  Patient does report diarrhea therefore could be why she is being incontinent of stool.  Patient was given a dose of her Robaxin for back pain and recommended close follow-up with her primary care doctor.  I discussed the results with patient, recommendations and follow-up.  Also discussed my standard return precautions.  Patient is in agreement with plan ? ? ?I, Rudene Re, attending MD, have personally viewed and interpreted the images obtained during this visit as below: ? ?MRI negative for spinal stenosis ? ? ?___________________________________________________ ?Interpretation by Radiologist:  ?DG Chest 2 View ? ?Result Date: 02/02/2022 ?CLINICAL DATA:  Chest pain.  Shortness of breath. EXAM: CHEST - 2 VIEW COMPARISON:  Thoracic spine MRI 12/16/2021 FINDINGS: Ill-defined 1.8 cm nodular density projects over the left mid chest. Cannot exclude intrapulmonary nodule. The lungs appear otherwise clear. Cardiac and mediastinal margins appear normal. No blunting of the costophrenic angles. Mild thoracic spondylosis. IMPRESSION: Possible 1.8 cm pulmonary nodule in the left mid lung. Chest CT is recommended for further assessment and characterization. Electronically Signed   By: Van Clines M.D.   On: 02/02/2022 20:09  ? ?MR LUMBAR SPINE WO CONTRAST ? ?Result Date: 02/03/2022 ?CLINICAL DATA:  Stool incontinence, back pain, urinary incontinence EXAM: MRI LUMBAR SPINE WITHOUT CONTRAST TECHNIQUE: Multiplanar, multisequence MR imaging of the lumbar spine was performed. No intravenous contrast was administered. COMPARISON:  12/16/2021 FINDINGS: Segmentation: Partial sacralization of L5 with a left L5-S1 pseudoarticulation of the broadened L5 transverse process with the sacral ala. Alignment:  No listhesis. Vertebrae:  No fracture, evidence of discitis, or bone lesion. Conus medullaris and cauda equina:  Conus extends to the L1 level. Conus and cauda equina appear normal. Paraspinal and other soft tissues: Fatty atrophy of the inferior paraspinous musculature. Disc levels: T12-L1: No significant disc bulge. No spinal canal stenosis or neural foraminal narrowing. L1-L2: No significant disc bulge. No spinal canal stenosis or neural foraminal narrowing. L2-L3: Mild central disc protrusion. Mild facet arthropathy. No spinal canal stenosis or neural foraminal narrowing. L3-L4: Mild disc bulge. Moderate facet arthropathy. No spinal canal stenosis. Mild left neural foraminal narrowing, unchanged. L4-L5: Disc bulge with superimposed central protrusion. Mild facet arthropathy. No spinal canal stenosis. Mild left neural foraminal narrowing. L5-S1: No significant disc bulge. No spinal canal stenosis or neural foraminal narrowing. IMPRESSION: 1. No spinal canal stenosis. 2. Multilevel facet arthropathy, worst at L3-L4, with mild left neural foraminal narrowing at L3-L4 and L4-L5. 3. Partial sacralization of L5, with a left L5-S1 pseudoarticulation of the broadened L5 transverse process with the sacral ala, which can be a cause of pain. Electronically Signed   By: Merilyn Baba M.D.   On: 02/03/2022 00:27   ? ?  ?Rudene Re, MD ?02/03/22 [id number] ? ?

## 2022-03-01 ENCOUNTER — Ambulatory Visit (INDEPENDENT_AMBULATORY_CARE_PROVIDER_SITE_OTHER): Payer: Managed Care, Other (non HMO) | Admitting: Pulmonary Disease

## 2022-03-01 ENCOUNTER — Encounter: Payer: Self-pay | Admitting: Pulmonary Disease

## 2022-03-01 VITALS — BP 120/80 | HR 78 | Temp 98.2°F | Ht 62.0 in | Wt 188.0 lb

## 2022-03-01 DIAGNOSIS — R0602 Shortness of breath: Secondary | ICD-10-CM

## 2022-03-01 DIAGNOSIS — R9389 Abnormal findings on diagnostic imaging of other specified body structures: Secondary | ICD-10-CM | POA: Diagnosis not present

## 2022-03-01 MED ORDER — ALBUTEROL SULFATE HFA 108 (90 BASE) MCG/ACT IN AERS: 2.0000 | INHALATION_SPRAY | Freq: Four times a day (QID) | RESPIRATORY_TRACT | 2 refills | Status: DC | PRN

## 2022-03-01 NOTE — Progress Notes (Signed)
Subjective:    Patient ID: Valerie Hopkins, female    DOB: 31-Dec-1960, 61 y.o.   MRN: [id number] Patient Care Team: System, Provider Not In as PCP - General  Chief Complaint  Patient presents with   Consult    F/u on cxr   HPI The patient is a 61 year old lifelong never smoker who is referred here for evaluation of a potential nodule noted on chest x-ray.  She is kindly referred by Dr. Phineas Semen.  Her primary care physician is Dr. Gwen Pounds. The patient does note daily cough productive of clear sputum and occasionally mucous plugs.  No particular trigger.  Nothing really helps.  This has been a chronic issue for her.  She also notices occasional wheezing.  No hemoptysis.  She has issues with shortness of breath on exertion for a number of years now.  She has had no fevers, chills or sweats.  She is being worked up at [facility] for chest pain.  The chest x-ray in question was performed 02 February 2022 for evaluation of chest pain and shortness of breath and there was a question of an ill-defined 1.8 cm nodular density over the left mid chest.  An intra pulmonary nodule could not be excluded.  On my review this appears to be due to confluence of shadows however the patient will need CT to clarify this issue.   She is employed as a Lawyer.  She has no other occupational exposures.   Review of Systems A 10 point review of systems was performed and it is as noted above otherwise negative.  Past Medical History:  Diagnosis Date   Hypertension    Migraine    Past Surgical History:  Procedure Laterality Date   ABDOMINAL HYSTERECTOMY     CHOLECYSTECTOMY     FRACTURE SURGERY     Patient Active Problem List   Diagnosis Date Noted   Thoracic aortic aneurysm without rupture (HCC) 12/21/2021   Myofascial muscle pain 06/07/2021   Temporomandibular joint disorder 03/04/2021   Chronic migraine 04/14/2020   Left cervical radiculopathy 04/30/2019   Left-sided low back pain with sciatica 02/04/2019    Left elbow pain 01/16/2017   Cervical spondylosis without myelopathy 02/10/2016   Metatarsalgia of both feet 02/02/2015   Gastroesophageal reflux disease without esophagitis 12/18/2014   H/O Helicobacter infection 12/18/2014   History of gastric ulcer 12/18/2014   Essential hypertension 03/04/2014   Family History  Problem Relation Age of Onset   Kidney failure Mother    Hypertension Mother    Heart disease Father    Social History   Tobacco Use   Smoking status: Never   Smokeless tobacco: Never  Substance Use Topics   Alcohol use: Never   Allergies  Allergen Reactions   Ibuprofen Nausea Only    Other reaction(s): Other (See Comments) GI issues   Codeine Nausea And Vomiting    Other reaction(s): Vomiting   Oxycodone-Acetaminophen Itching   Current Meds  Medication Sig   butalbital-acetaminophen-caffeine (FIORICET, ESGIC) 50-325-40 MG tablet Take by mouth.   cholecalciferol (VITAMIN D3) 25 MCG (1000 UNIT) tablet Take 1,000 Units by mouth daily.   Folic Acid-Vit B6-Vit B12 (FOLBEE) 2.5-25-1 MG TABS tablet Take 1 tablet by mouth daily.   losartan-hydrochlorothiazide (HYZAAR) 100-12.5 MG tablet    methocarbamol (ROBAXIN) 500 MG tablet Take by mouth.   topiramate (TOPAMAX) 50 MG tablet    Immunization History  Administered Date(s) Administered   Influenza,inj,Quad PF,6+ Mos 08/18/2021   MMR 08/26/2004, 03/02/2011  Moderna Sars-Covid-2 Vaccination 01/17/2020, 02/14/2020, 09/22/2020, 04/29/2021   Pfizer Covid-19 Vaccine Bivalent Booster 76yrs & up 01/18/2022   Tdap 01/01/2018   Zoster Recombinat (Shingrix) 04/14/2020, 11/23/2020      Objective:   Physical Exam BP 120/80 (BP Location: Left Arm, Patient Position: Sitting, Cuff Size: Large)   Pulse 78   Temp 98.2 F (36.8 C)   Ht 5\' 2"  (1.575 m)   Wt 188 lb (85.3 kg)   SpO2 100%   BMI 34.39 kg/m  GENERAL: Obese woman, no acute distress, ambulates with assistance of a cane. HEAD: Normocephalic, atraumatic.   EYES: Pupils equal, round, reactive to light.  No scleral icterus.  MOUTH: Oral mucosa moist.  No thrush. NECK: Supple. No thyromegaly. Trachea midline. No JVD.  No adenopathy. PULMONARY: Good air entry bilaterally.  Scattered end expiratory wheezes. CARDIOVASCULAR: S1 and S2. Regular rate and rhythm.  Normal ABDOMEN: Obese otherwise benign. MUSCULOSKELETAL: No joint deformity, no clubbing, no edema.  NEUROLOGIC: No overt focal deficit. SKIN: Intact,warm,dry. PSYCH: Mood and behavior normal.  Chest x-ray was reviewed.    Assessment & Plan:     ICD-10-CM   1. Abnormal CXR  R93.89 CT CHEST WO CONTRAST   Will obtain CT chest to clarify potential nodule issue    2. Shortness of breath  R06.02 Pulmonary Function Test [facility] Only   Suspect asthma PFTs Ventolin as needed for now      Meds ordered this encounter  Medications   albuterol (VENTOLIN HFA) 108 (90 Base) MCG/ACT inhaler    Sig: Inhale 2 puffs into the lungs every 6 (six) hours as needed for wheezing or shortness of breath.    Dispense:  8 g    Refill:  2   Will give the patient a trial of albuterol for now.  Will obtain PFTs.  CT chest to evaluate potential nodule.  Will see the patient in follow-up in 6 to 8 weeks time she is to call sooner should any new problems arise.  Gailen Shelter, MD Advanced Bronchoscopy [facility]    *This note was dictated using voice recognition software/Dragon.  Despite best efforts to proofread, errors can occur which can change the meaning. Any transcriptional errors that result from this process are unintentional and may not be fully corrected at the time of dictation.

## 2022-03-01 NOTE — Patient Instructions (Signed)
We are going to schedule a CT scan of the chest to assess the potential spot that was seen in your lung.  As we discussed I suspect this was a small area of pneumonia. ? ?We are going to get some breathing tests to make sure you do not have asthma. ? ?We have sent a prescription for your Ventolin inhaler to your pharmacy. ? ?See you in follow-up in 6 to 8 weeks time call sooner should any new problems arise. ?

## 2022-04-04 ENCOUNTER — Telehealth: Payer: Self-pay | Admitting: Pulmonary Disease

## 2022-04-04 NOTE — Telephone Encounter (Signed)
Insurance wasn't approving the nPI [id number] which includes [facility] [place]. When I called the patient to let her know that we needed to change the CT location she stated that a couple of weeks ago she had a CT at [facility]. Dr. Jayme Cloud she wanted you to review to see if this CT would be ok for her and we will CXL the CT scheduled on 04/19/22 ?

## 2022-04-04 NOTE — Telephone Encounter (Signed)
Lm for patient.  

## 2022-04-04 NOTE — Telephone Encounter (Signed)
Unfortunately, I cannot see the films performed at the [facility] cancer center.  She would have to get a disc so that we could then see it over here. ?

## 2022-04-06 NOTE — Telephone Encounter (Signed)
Lm x2 for patient.  Will close encounter per office protocol.   

## 2022-04-19 ENCOUNTER — Other Ambulatory Visit: Payer: Managed Care, Other (non HMO)

## 2022-05-05 ENCOUNTER — Ambulatory Visit: Payer: Managed Care, Other (non HMO) | Admitting: Pulmonary Disease

## 2022-05-11 ENCOUNTER — Telehealth: Payer: Self-pay | Admitting: Pulmonary Disease

## 2022-05-11 NOTE — Telephone Encounter (Signed)
The chest CT that I ordered was actually done at [facility] on 20 April and did not show any abnormality in the lungs that was of concern.  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  From her last CT in April there was nothing further to do for imaging in the lung but that was done at [facility] again I have not seen the images but have the report.  I do not think that her chest pain is coming from the lung issue but rather from her subclavian artery issue and issues with her spine that they are working up at [facility].  I think that she can keep the appointment she has.

## 2022-05-11 NOTE — Telephone Encounter (Signed)
Spoke to patient.  She stated that she was recently admitted at [facility] for chest pain. It was mentioned during admission that she was due for chest CT. She felt that she had imaging during admission but can not recall what type. She is scheduled to see Dr. Jayme Cloud 07/20/2022. She is concerned that she should be seen sooner then 07/20/2022. Offered sooner appt with NP in [place] and patient declined.   Dr. Jayme Cloud, please advise. thanks

## 2022-05-11 NOTE — Telephone Encounter (Signed)
Lm for patient.  

## 2022-05-12 NOTE — Telephone Encounter (Signed)
Patient is aware of below message and voiced her understanding.  Nothing further needed.   

## 2022-06-07 ENCOUNTER — Other Ambulatory Visit: Payer: Self-pay

## 2022-06-07 ENCOUNTER — Emergency Department: Payer: Commercial Managed Care - HMO

## 2022-06-07 ENCOUNTER — Emergency Department
Admission: EM | Admit: 2022-06-07 | Discharge: 2022-06-08 | Disposition: A | Discharge status: Home / Self Care | Payer: Commercial Managed Care - HMO | Attending: Emergency Medicine | Admitting: Emergency Medicine

## 2022-06-07 ENCOUNTER — Encounter: Payer: Self-pay | Admitting: Emergency Medicine

## 2022-06-07 DIAGNOSIS — R0789 Other chest pain: Secondary | ICD-10-CM | POA: Insufficient documentation

## 2022-06-07 DIAGNOSIS — R42 Dizziness and giddiness: Secondary | ICD-10-CM | POA: Insufficient documentation

## 2022-06-07 LAB — BASIC METABOLIC PANEL
Anion gap: 8 (ref 5–15)
BUN: 21 mg/dL — ABNORMAL HIGH (ref 6–20)
CO2: 21 mmol/L — ABNORMAL LOW (ref 22–32)
Calcium: 9.1 mg/dL (ref 8.9–10.3)
Chloride: 107 mmol/L (ref 98–111)
Creatinine, Ser: 1.02 mg/dL — ABNORMAL HIGH (ref 0.44–1.00)
GFR, Estimated: >60 mL/min (ref >60)
Glucose, Bld: 213 mg/dL — ABNORMAL HIGH (ref 70–99)
Potassium: 3.1 mmol/L — ABNORMAL LOW (ref 3.5–5.1)
Sodium: 136 mmol/L (ref 135–145)

## 2022-06-07 LAB — CBC
HCT: 43.6 % (ref 36.0–46.0)
Hemoglobin: 13.4 g/dL (ref 12.0–15.0)
MCH: 27.6 pg (ref 26.0–34.0)
MCHC: 30.7 g/dL (ref 30.0–36.0)
MCV: 89.9 fL (ref 80.0–100.0)
Platelets: 267 10*3/uL (ref 150–400)
RBC: 4.85 MIL/uL (ref 3.87–5.11)
RDW: 12.8 % (ref 11.5–15.5)
WBC: 5.6 10*3/uL (ref 4.0–10.5)
nRBC: 0 % (ref 0.0–0.2)

## 2022-06-07 LAB — TROPONIN I (HIGH SENSITIVITY): Troponin I (High Sensitivity): 3 ng/L (ref <18)

## 2022-06-07 MED ORDER — LACTULOSE 10 GM/15ML PO SOLN
ORAL | Status: AC
  Filled 2022-06-07: qty 30

## 2022-06-07 MED ORDER — KETOROLAC TROMETHAMINE 30 MG/ML IJ SOLN
30.0000 mg | Freq: Once | INTRAMUSCULAR | Status: AC
Start: 2022-06-07 — End: 2022-06-07
  Administered 2022-06-07: 30 mg via INTRAVENOUS
  Filled 2022-06-07: qty 1

## 2022-06-07 MED ORDER — IOHEXOL 350 MG/ML SOLN
75.0000 mL | Freq: Once | INTRAVENOUS | Status: AC | PRN
  Administered 2022-06-07: 75 mL via INTRAVENOUS

## 2022-06-07 MED ORDER — ALUM & MAG HYDROXIDE-SIMETH 200-200-20 MG/5ML PO SUSP
30.0000 mL | Freq: Once | ORAL | Status: AC
  Administered 2022-06-07: 30 mL via ORAL
  Filled 2022-06-07: qty 30

## 2022-06-07 NOTE — ED Triage Notes (Signed)
Pt presents via POV with complaints of left sided CP following an ESI injection of (L5-S1) today around noon. Pt started feeling pain, dizziness, and headache starting around 130. Pt states this is her second time having this injection but the first time she has had this type of pain. Denies LOC.

## 2022-06-07 NOTE — ED Notes (Signed)
Patient provided with blanket per request.  

## 2022-06-07 NOTE — ED Provider Triage Note (Signed)
Emergency Medicine Provider Triage Evaluation Note  Valerie Hopkins, a 61 y.o. female  was evaluated in triage.  Pt complains of chest heaviness and dizziness.  She presents after an 11 AM procedure for her second L5-S1 left ESI.  She got home at about 1:30 began to experience symptoms but she denies any syncope, nausea, vomiting, or dizziness.  Review of Systems  Positive: Chest heaviness Negative: LOC, FCS  Physical Exam  There were no vitals taken for this visit. Gen:   Awake, no distress   Resp:  Normal effort  MSK:   Moves extremities without difficulty  Other:    Medical Decision Making  Medically screening exam initiated at 7:00 PM.  Appropriate orders placed.  Valerie Hopkins was informed that the remainder of the evaluation will be completed by another provider, this initial triage assessment does not replace that evaluation, and the importance of remaining in the ED until their evaluation is complete.  To the ED for evaluation of chest tightness and dizziness after a outpatient lumbar ESI procedure.   Lissa Hoard, PA-C 06/07/22 [id number]

## 2022-06-07 NOTE — ED Provider Notes (Signed)
[facility] Provider Note   Event Date/Time   First MD Initiated Contact with Patient 06/07/22 2016     (approximate) History  Chest Pain  HPI Shaundra Fullam is a 61 y.o. female with a stated past medical history of myofascial pain syndrome, cervical radiculopathy, chronic low back pain, chronic neck pain, and chronic migraines who presents for left-sided pain underneath her left breast after a lumbar spine injection earlier today.  Patient states that she has had chest pain since that time that has not improved and is described as 6/10 and aching.  Patient denies any pain similar to this on previous injections. ROS: Patient currently denies any vision changes, tinnitus, difficulty speaking, facial droop, sore throat, shortness of breath, abdominal pain, nausea/vomiting/diarrhea, dysuria, or weakness/numbness/paresthesias in any extremity   Physical Exam  Triage Vital Signs: ED Triage Vitals  Enc Vitals Group     BP 06/07/22 1908 (!) 125/93     Pulse Rate 06/07/22 1908 100     Resp 06/07/22 1908 20     Temp 06/07/22 1908 98.1 F (36.7 C)     Temp Source 06/07/22 1908 Oral     SpO2 06/07/22 1908 98 %     Weight 06/07/22 1903 187 lb (84.8 kg)     Height 06/07/22 1903 5\' 2"  (1.575 m)     Head Circumference --      Peak Flow --      Pain Score 06/07/22 1903 6     Pain Loc --      Pain Edu? --      Excl. in GC? --    Most recent vital signs: Vitals:   06/07/22 1908 06/07/22 2044  BP: (!) 125/93 105/74  Pulse: 100 (!) 103  Resp: 20 16  Temp: 98.1 F (36.7 C)   SpO2: 98% 99%   General: Awake, oriented x4. CV:  Good peripheral perfusion.  Resp:  Normal effort.  Abd:  No distention.  Other:  Middle-aged obese African-American female laying in bed in mild distress secondary to pain.  Nonreproducible chest wall pain ED Results / Procedures / Treatments  Labs (all labs ordered are listed, but only abnormal results are displayed) Labs Reviewed  BASIC  METABOLIC PANEL - Abnormal; Notable for the following components:      Result Value   Potassium 3.1 (*)    CO2 21 (*)    Glucose, Bld 213 (*)    BUN 21 (*)    Creatinine, Ser 1.02 (*)    All other components within normal limits  CBC  TROPONIN I (HIGH SENSITIVITY)  TROPONIN I (HIGH SENSITIVITY)   EKG ED ECG REPORT I, 2045, the attending physician, personally viewed and interpreted this ECG. Date: 06/07/2022 EKG Time: 1902 Rate: 108 Rhythm: Tachycardic sinus rhythm QRS Axis: normal Intervals: normal ST/T Wave abnormalities: normal Narrative Interpretation: Tachycardic sinus rhythm.  No evidence of acute ischemia RADIOLOGY ED MD interpretation: 2 view chest x-ray interpreted by me shows no evidence of acute abnormalities including no pneumonia, pneumothorax, or widened mediastinum -Agree with radiology assessment Official radiology report(s): DG Chest 2 View  Result Date: 06/07/2022 CLINICAL DATA:  Chest pain following recent epidural steroid injection, headache EXAM: CHEST - 2 VIEW COMPARISON:  02/02/2022 FINDINGS: Frontal and lateral views of the chest demonstrate an unremarkable cardiac silhouette. No airspace disease, effusion, or pneumothorax. No acute bony abnormality. IMPRESSION: 1. No acute intrathoracic process. Electronically Signed   By: 02/04/2022 M.D.   On:  06/07/2022 19:53   PROCEDURES: Critical Care performed: No Procedures MEDICATIONS ORDERED IN ED: Medications  ketorolac (TORADOL) 30 MG/ML injection 30 mg (has no administration in time range)   IMPRESSION / MDM / ASSESSMENT AND PLAN / ED COURSE  I reviewed the triage vital signs and the nursing notes.                             The patient is on the cardiac monitor to evaluate for evidence of arrhythmia and/or significant heart rate changes. Patient's presentation is most consistent with acute presentation with potential threat to life or bodily function. Workup: ECG, CXR, CBC, BMP,  Troponin Findings: ECG: No overt evidence of STEMI. No evidence of Brugadas sign, delta wave, epsilon wave, significantly prolonged QTc, or malignant arrhythmia HS Troponin: Negative x1 Other Labs unremarkable for emergent problems. CXR: Without PTX, PNA, or widened mediastinum Last Stress Test: Denies Last Heart Catheterization: Denies HEART Score: 3  Given History, Exam, and Workup differential diagnosis: ACS, Pneumothorax, Pneumonia, Pulmonary Embolus, Tamponade, Aortic Dissection or other emergent problem as a cause for this presentation.    Disposition: Pending CT angiography of the chest.  Insert signout    FINAL CLINICAL IMPRESSION(S) / ED DIAGNOSES   Final diagnoses:  None   Rx / DC Orders   ED Discharge Orders     None      Note:  This document was prepared using Dragon voice recognition software and may include unintentional dictation errors.   Merwyn Katos, MD 06/11/22 [phone]

## 2022-06-07 NOTE — ED Notes (Signed)
Patient in CT at this time.

## 2022-06-08 LAB — TROPONIN I (HIGH SENSITIVITY): Troponin I (High Sensitivity): 3 ng/L (ref <18)

## 2022-06-08 NOTE — ED Provider Notes (Signed)
12:30 AM  Assumed care at shift change.  Patient here with chest heaviness and dizziness after receiving epidural injection.  Plan was to follow-up on CTA of the chest and second troponin.  She has had 2 negative troponins here.  CTA of the chest reviewed/interpreted by myself radiologist and shows no infiltrate, edema, pneumothorax, PE or other acute abnormality.  Symptoms seem atypical for ACS and she has no significant risk factors other than hypertension, age.  I feel she is safe for discharge home with outpatient follow-up.   At this time, I do not feel there is any life-threatening condition present. I reviewed all nursing notes, vitals, pertinent previous records.  All lab and urine results, EKGs, imaging ordered have been independently reviewed and interpreted by myself.  I reviewed all available radiology reports from any imaging ordered this visit.  Based on my assessment, I feel the patient is safe to be discharged home without further emergent workup and can continue workup as an outpatient as needed. Discussed all findings, treatment plan as well as usual and customary return precautions.  They verbalize understanding and are comfortable with this plan.  Outpatient follow-up has been provided as needed.  All questions have been answered.    Jodey Burbano, Layla Maw, DO 06/08/22 [phone]

## 2022-06-29 ENCOUNTER — Other Ambulatory Visit: Payer: Self-pay | Admitting: Family Medicine

## 2022-06-29 DIAGNOSIS — M5412 Radiculopathy, cervical region: Secondary | ICD-10-CM

## 2022-07-05 ENCOUNTER — Other Ambulatory Visit: Payer: Self-pay | Admitting: Family Medicine

## 2022-07-05 DIAGNOSIS — M4802 Spinal stenosis, cervical region: Secondary | ICD-10-CM

## 2022-07-05 DIAGNOSIS — M5412 Radiculopathy, cervical region: Secondary | ICD-10-CM

## 2022-07-13 ENCOUNTER — Ambulatory Visit
Admission: RE | Admit: 2022-07-13 | Discharge: 2022-07-13 | Disposition: A | Discharge status: Home / Self Care | Payer: Commercial Managed Care - HMO | Source: Ambulatory Visit | Attending: Family Medicine | Admitting: Family Medicine

## 2022-07-13 DIAGNOSIS — M5412 Radiculopathy, cervical region: Secondary | ICD-10-CM

## 2022-07-13 DIAGNOSIS — M4802 Spinal stenosis, cervical region: Secondary | ICD-10-CM

## 2022-07-20 ENCOUNTER — Ambulatory Visit (INDEPENDENT_AMBULATORY_CARE_PROVIDER_SITE_OTHER): Payer: Commercial Managed Care - HMO | Admitting: Pulmonary Disease

## 2022-07-20 ENCOUNTER — Encounter: Payer: Self-pay | Admitting: Pulmonary Disease

## 2022-07-20 VITALS — BP 128/78 | HR 74 | Temp 98.3°F | Ht 62.0 in | Wt 189.2 lb

## 2022-07-20 DIAGNOSIS — R0602 Shortness of breath: Secondary | ICD-10-CM | POA: Diagnosis not present

## 2022-07-20 DIAGNOSIS — J45909 Unspecified asthma, uncomplicated: Secondary | ICD-10-CM | POA: Diagnosis not present

## 2022-07-20 DIAGNOSIS — R9389 Abnormal findings on diagnostic imaging of other specified body structures: Secondary | ICD-10-CM | POA: Diagnosis not present

## 2022-07-20 MED ORDER — TRELEGY ELLIPTA 200-62.5-25 MCG/ACT IN AEPB: 1.0000 | INHALATION_SPRAY | Freq: Every day | RESPIRATORY_TRACT | 0 refills | Status: DC

## 2022-07-20 NOTE — Patient Instructions (Signed)
We are giving you a trial of an inhaler called Trelegy Ellipta.  Make sure you rinse your mouth well after you use it.  The inhaler is 1 puff daily.  Make sure you take it around the same time every day.  Let us know how you do with the inhaler so we can call in the prescription to your pharmacy.  We still need to get breathing test because this will tell me more about your issues with breathing and cough.  Let us see you in follow-up in 2 to 3 months time call sooner should any new problems arise.

## 2022-07-20 NOTE — Progress Notes (Signed)
Subjective:    Patient ID: Valerie Hopkins, female    DOB: [birth year], 61 y.o.   MRN: [id number] Patient Care Team: System, Provider Not In as PCP - General  Chief Complaint  Patient presents with   Follow-up    Prod cough with clear sputum.    HPI The patient is a 61 year old lifelong never smoker who was initially referred here for evaluation of a potential nodule noted on chest x-ray.  Her initial visit here was on 01 March 2022.  We requested a chest CT and PFTs at the time of that evaluation.  The patient has not had PFTs performed.  He actually had a CT chest on 10 March 2022 done at [facility].  This chest CT showed mucous plugging in the left upper lobe and a sub-4 mm pulmonary nodule in the right upper lobe that appear to be stable since 2022.  There were no other abnormal findings on the lung fields.  This is all by report as we do not have these films for review.  The patient does note daily cough productive of clear sputum and occasionally mucous plugs.  No particular trigger.  Nothing really helps.  This has been a chronic issue for her.  She also notices occasional wheezing.  No hemoptysis.  She has been worked up for chest pain that appears to be related to subclavian artery stenosis.  His issues are being worked up at [facility].  At her initial visit in April we postulated that she probably had asthma however she has not followed through with PFTs as noted.  Review of Systems A 10 point review of systems was performed and it is as noted above otherwise negative.  Patient Active Problem List   Diagnosis Date Noted   Thoracic aortic aneurysm without rupture ([facility]) 12/21/2021   Myofascial muscle pain 06/07/2021   Temporomandibular joint disorder 03/04/2021   Chronic migraine 04/14/2020   Left cervical radiculopathy 04/30/2019   Left-sided low back pain with sciatica 02/04/2019   Left elbow pain 01/16/2017   Cervical spondylosis without myelopathy 02/10/2016    Metatarsalgia of both feet 02/02/2015   Gastroesophageal reflux disease without esophagitis [id number]   H/O Helicobacter infection 12/18/2014   History of gastric ulcer 12/18/2014   Essential hypertension 03/04/2014   Social History   Tobacco Use   Smoking status: Never   Smokeless tobacco: Never  Substance Use Topics   Alcohol use: Never   Allergies  Allergen Reactions   Ibuprofen Nausea Only    Other reaction(s): Other (See Comments) GI issues   Codeine Nausea And Vomiting    Other reaction(s): Vomiting   Oxycodone-Acetaminophen Itching   Current Meds  Medication Sig   albuterol (VENTOLIN HFA) 108 (90 Base) MCG/ACT inhaler Inhale 2 puffs into the lungs every 6 (six) hours as needed for wheezing or shortness of breath.   butalbital-acetaminophen-caffeine (FIORICET, ESGIC) 50-325-40 MG tablet Take by mouth.   cholecalciferol (VITAMIN D3) 25 MCG (1000 UNIT) tablet Take 1,000 Units by mouth daily.   Folic Acid-Vit Q000111Q [id number] (FOLBEE) 2.5-25-1 MG TABS tablet Take 1 tablet by mouth daily.   losartan-hydrochlorothiazide (HYZAAR) 100-12.5 MG tablet    methocarbamol (ROBAXIN) 500 MG tablet Take by mouth.   Multiple Vitamin (MULTIVITAMIN ADULT PO) Take by mouth.   mupirocin ointment (BACTROBAN) 2 % Apply two times a day for 7 days.   topiramate (TOPAMAX) 50 MG tablet    triamcinolone cream (KENALOG) 0.1 % Apply 1 application topically 2 (two) times  daily.   Immunization History  Administered Date(s) Administered   Dtap, Unspecified 06/28/1967, 07/26/1967, 10/11/1967, 02/20/1975   Influenza,inj,Quad PF,6+ Mos 08/19/2016, 09/26/2017, 08/18/2021   Influenza-Unspecified 08/08/2013, 08/08/2013, 08/11/2014, 08/11/2014, 08/10/2015, 08/10/2015, 06/25/2019, 06/25/2019, 08/15/2020   MMR 08/26/2004, 03/02/2011   Moderna Sars-Covid-2 Vaccination 01/17/2020, 02/14/2020, 09/22/2020, 04/29/2021   PPD Test 01/01/2018   Pfizer Covid-19 Vaccine Bivalent Booster 66yr & up 01/18/2022   Td  (Adult),unspecified 06/25/2007, 02/15/2022   Tdap 01/01/2018, 02/15/2022   Zoster Recombinat (Shingrix) 04/14/2020, 11/23/2020      Objective:   Physical Exam BP 128/78 (BP Location: Left Arm, Cuff Size: Normal)   Pulse 74   Temp 98.3 F (36.8 C) (Temporal)   Ht '5\' 2"'$  (1.575 m)   Wt 189 lb 3.2 oz (85.8 kg)   SpO2 100%   BMI 34.61 kg/m  GENERAL: Obese woman, no acute distress, ambulates with assistance of a cane. HEAD: Normocephalic, atraumatic.  EYES: Pupils equal, round, reactive to light.  No scleral icterus.  MOUTH: Oral mucosa moist.  No thrush. NECK: Supple. No thyromegaly. Trachea midline. No JVD.  No adenopathy. PULMONARY: Good air entry bilaterally.  Scattered end expiratory wheezes. CARDIOVASCULAR: S1 and S2. Regular rate and rhythm.  Normal ABDOMEN: Obese otherwise benign. MUSCULOSKELETAL: No joint deformity, no clubbing, no edema.  NEUROLOGIC: No overt focal deficit. SKIN: Intact,warm,dry. PSYCH: Mood and behavior normal.      Assessment & Plan:     ICD-10-CM   1. Shortness of breath  R06.02 Pulmonary Function Test [facility] Only   Needs to follow through with PFTs    2. Persistent asthma without complication, unspecified asthma severity  J45.909    Suspect persistent asthma Trial of Trelegy, 1 inhalation daily    3. Abnormal CXR  R93.89    Follow-up CT chest benign Mucous plugging noted No significant nodules except for tiny 4 mm nodule     Orders Placed This Encounter  Procedures   Pulmonary Function Test [facility] Only    Standing Status:   Future    Number of Occurrences:   1    Standing Expiration Date:   07/21/2023    Scheduling Instructions:     236mo Meds ordered this encounter  Medications   Fluticasone-Umeclidin-Vilant (TRELEGY ELLIPTA) 200-62.5-25 MCG/ACT AEPB    Sig: Inhale 1 puff into the lungs daily.    Dispense:  14 each    Refill:  0    Order Specific Question:   Lot Number?    Answer:   [id number]  Order Specific Question:   Expiration Date?     Answer:   12/23/2023    Order Specific Question:   Manufacturer?    Answer:   [employer] [[id number]]    Order Specific Question:   Quantity    Answer:   2   Patient is to let usKoreanow how Trelegy works for her.  She was provided with samples.  She was advised to follow through with obtaining PFTs, these have been reordered.  We will see her in follow-up in 2 to 3 months time she is to contact usKorearior to that time should any new difficulties arise.  C.Renold DonMD Advanced Bronchoscopy PCCM [facility]-    *This note was dictated using voice recognition software/Dragon.  Despite best efforts to proofread, errors can occur which can change the meaning. Any transcriptional errors that result from this process are unintentional and may not be fully corrected at the time of dictation.

## 2022-09-21 ENCOUNTER — Other Ambulatory Visit: Payer: Self-pay | Admitting: Internal Medicine

## 2022-09-21 DIAGNOSIS — I2089 Other forms of angina pectoris: Secondary | ICD-10-CM

## 2022-09-22 ENCOUNTER — Ambulatory Visit: Payer: Commercial Managed Care - HMO | Admitting: Pulmonary Disease

## 2022-09-22 ENCOUNTER — Telehealth (HOSPITAL_COMMUNITY): Payer: Self-pay | Admitting: Emergency Medicine

## 2022-09-22 DIAGNOSIS — R079 Chest pain, unspecified: Secondary | ICD-10-CM

## 2022-09-22 MED ORDER — IVABRADINE HCL 5 MG PO TABS: 15.0000 mg | ORAL_TABLET | Freq: Once | ORAL | 0 refills | Status: AC

## 2022-09-22 MED ORDER — METOPROLOL TARTRATE 100 MG PO TABS: 100.0000 mg | ORAL_TABLET | Freq: Once | ORAL | 0 refills | Status: DC

## 2022-09-22 NOTE — Telephone Encounter (Signed)
Unable to leave vm.  Mychart message sent with CT Cardiac instructions   Marchia Bond RN Navigator Cardiac Imaging [facility] [phone] Office  [phone] Cell

## 2022-09-26 ENCOUNTER — Telehealth (HOSPITAL_COMMUNITY): Payer: Self-pay | Admitting: Emergency Medicine

## 2022-09-26 ENCOUNTER — Ambulatory Visit
Admission: RE | Admit: 2022-09-26 | Discharge: 2022-09-26 | Disposition: A | Discharge status: Home / Self Care | Payer: Medicare Other | Source: Ambulatory Visit | Attending: Internal Medicine | Admitting: Internal Medicine

## 2022-09-26 DIAGNOSIS — I2089 Other forms of angina pectoris: Secondary | ICD-10-CM | POA: Diagnosis present

## 2022-09-26 DIAGNOSIS — R079 Chest pain, unspecified: Secondary | ICD-10-CM

## 2022-09-26 MED ORDER — NITROGLYCERIN 0.4 MG SL SUBL
0.8000 mg | SUBLINGUAL_TABLET | Freq: Once | SUBLINGUAL | Status: AC
  Administered 2022-09-26: 0.8 mg via SUBLINGUAL

## 2022-09-26 MED ORDER — METOPROLOL TARTRATE 100 MG PO TABS: 100.0000 mg | ORAL_TABLET | Freq: Once | ORAL | 0 refills | Status: DC

## 2022-09-26 MED ORDER — IVABRADINE HCL 5 MG PO TABS: 15.0000 mg | ORAL_TABLET | Freq: Once | ORAL | 0 refills | Status: AC

## 2022-09-26 MED ORDER — IOHEXOL 350 MG/ML SOLN
100.0000 mL | Freq: Once | INTRAVENOUS | Status: AC | PRN
  Administered 2022-09-26: 100 mL via INTRAVENOUS

## 2022-09-26 MED ORDER — METOPROLOL TARTRATE 5 MG/5ML IV SOLN: 10.0000 mg | Freq: Once | INTRAVENOUS | Status: DC

## 2022-09-26 NOTE — Telephone Encounter (Signed)
Reaching out to patient to offer assistance regarding upcoming cardiac imaging study; pt verbalizes understanding of appt date/time, parking situation and where to check in, pre-test NPO status and medications ordered, and verified current allergies; name and call back number provided for further questions should they arise Marchia Bond RN Navigator Cardiac Imaging [facility] [phone] office [phone] cell  Sent rx to [facility] [place] Arrival [address] [facility] DIFFICULT IV (encouraged plenty PO hydration)

## 2022-09-26 NOTE — Progress Notes (Signed)
Patient tolerated procedure well. Ambulate w/o difficulty. Denies any lightheadedness or being dizzy. Pt denies any pain at this time. Sitting in chair, pt is encouraged to drink additional water throughout the day and reason explained to patient. Patient verbalized understanding and all questions answered. ABC intact. No further needs at this time. Discharge from procedure area w/o issues.  

## 2022-09-26 NOTE — Progress Notes (Signed)
Arrived to CT 3 Pt has already completed procedure. PIV placed by CT staff. Fran Lowes, RN VAST

## 2022-10-19 ENCOUNTER — Ambulatory Visit: Payer: Medicare Other | Attending: Pulmonary Disease

## 2022-10-19 DIAGNOSIS — R0602 Shortness of breath: Secondary | ICD-10-CM | POA: Insufficient documentation

## 2022-10-19 LAB — PULMONARY FUNCTION TEST ARMC ONLY
DL/VA % pred: 96 %
DL/VA: 4.12 ml/min/mmHg/L
DLCO unc % pred: 77 %
DLCO unc: 14.64 ml/min/mmHg
FEF 25-75 Pre: 3.36 L/sec
FEF2575-%Pred-Pre: 150 %
FEV1-%Change-Post: -42 %
FEV1-%Pred-Post: 54 %
FEV1-%Pred-Pre: 93 %
FEV1-Post: 1.28 L
FEV1-Pre: 2.22 L
FEV1FVC-%Change-Post: 0 %
FEV1FVC-%Pred-Pre: 112 %
FEV6-%Change-Post: -42 %
FEV6-%Pred-Post: 49 %
FEV6-%Pred-Pre: 85 %
FEV6-Post: 1.45 L
FEV6-Pre: 2.54 L
FEV6FVC-%Pred-Post: 103 %
FEV6FVC-%Pred-Pre: 103 %
FVC-%Change-Post: -42 %
FVC-%Pred-Post: 47 %
FVC-%Pred-Pre: 82 %
FVC-Post: 1.45 L
FVC-Pre: 2.54 L
Post FEV1/FVC ratio: 88 %
Post FEV6/FVC ratio: 100 %
Pre FEV1/FVC ratio: 88 %
Pre FEV6/FVC Ratio: 100 %
RV % pred: 1 %
RV: 0.03 L
TLC % pred: 64 %
TLC: 3.06 L

## 2022-10-19 MED ORDER — ALBUTEROL SULFATE (2.5 MG/3ML) 0.083% IN NEBU
2.5000 mg | INHALATION_SOLUTION | Freq: Once | RESPIRATORY_TRACT | Status: AC
  Administered 2022-10-19: 2.5 mg via RESPIRATORY_TRACT
  Filled 2022-10-19: qty 3

## 2022-11-08 ENCOUNTER — Ambulatory Visit: Payer: Commercial Managed Care - HMO | Admitting: Pulmonary Disease

## 2022-11-17 ENCOUNTER — Encounter: Payer: Self-pay | Admitting: Pulmonary Disease

## 2022-11-17 ENCOUNTER — Ambulatory Visit (INDEPENDENT_AMBULATORY_CARE_PROVIDER_SITE_OTHER): Payer: Medicare Other | Admitting: Pulmonary Disease

## 2022-11-17 VITALS — BP 120/80 | HR 84 | Temp 97.1°F | Ht 64.0 in | Wt 194.0 lb

## 2022-11-17 DIAGNOSIS — J683 Other acute and subacute respiratory conditions due to chemicals, gases, fumes and vapors: Secondary | ICD-10-CM

## 2022-11-17 DIAGNOSIS — R911 Solitary pulmonary nodule: Secondary | ICD-10-CM | POA: Diagnosis not present

## 2022-11-17 DIAGNOSIS — R0602 Shortness of breath: Secondary | ICD-10-CM | POA: Diagnosis not present

## 2022-11-17 DIAGNOSIS — L03116 Cellulitis of left lower limb: Secondary | ICD-10-CM | POA: Diagnosis not present

## 2022-11-17 LAB — NITRIC OXIDE: Nitric Oxide: 14

## 2022-11-17 MED ORDER — CEPHALEXIN 500 MG PO CAPS: 500.0000 mg | ORAL_CAPSULE | Freq: Four times a day (QID) | ORAL | 0 refills | Status: AC

## 2022-11-17 MED ORDER — ALBUTEROL SULFATE HFA 108 (90 BASE) MCG/ACT IN AERS: 2.0000 | INHALATION_SPRAY | Freq: Four times a day (QID) | RESPIRATORY_TRACT | 6 refills | Status: DC | PRN

## 2022-11-17 MED ORDER — BREZTRI AEROSPHERE 160-9-4.8 MCG/ACT IN AERO: 2.0000 | INHALATION_SPRAY | Freq: Two times a day (BID) | RESPIRATORY_TRACT | 11 refills | Status: DC

## 2022-11-17 NOTE — Progress Notes (Signed)
Subjective:    Patient ID: Valerie Hopkins, female    DOB: Oct 21, 1961, 61 y.o.   MRN: [id number] Patient Care Team: Kennis Carina, MD as PCP - General (Family Medicine) Salena Saner, MD as Consulting Physician (Pulmonary Disease)  Chief Complaint  Patient presents with   Follow-up    Upper back pain and SOB when she takes a deep breath. Cough with clear sputum. Sore throat. Sweats. All this has been going on for 2 weeks.   HPI Patient is a 61 year old lifelong never smoker who presents here for the issue of a lung nodule and reactive airways disease/asthma.  She presents today for a scheduled follow-up visit, last seen here on 20 July 2022.  She states that over the last 2 weeks she has had upper back pain and shortness of breath.  She has had imaging through [facility].  He has also noted a redness and swelling of the left lower extremity, reportedly ultrasound of the lower extremity at [facility] did not show any evidence of DVT.  She has not had any fevers chills or sweats.,  No fever or chills but does note night sweats..  Recent [facility] x[facility] at [facility] showed no acute abnormality per report.  D-dimers have been negative.  She is not using her Trelegy Ellipta daily.  Not explain why she is not taking the medication regularly.  Using albuterol perhaps once a day.  Does not endorse any other symptomatology.  Review of Systems A 10 point review of systems was performed and it is as noted above otherwise negative.  Patient Active Problem List   Diagnosis Date Noted   Thoracic aortic aneurysm without rupture (HCC) 12/21/2021   Myofascial muscle pain 06/07/2021   Temporomandibular joint disorder 03/04/2021   Chronic migraine 04/14/2020   Left cervical radiculopathy 04/30/2019   Left-sided low back pain with sciatica 02/04/2019   Left elbow pain 01/16/2017   Cervical spondylosis without myelopathy 02/10/2016   Metatarsalgia of both feet 02/02/2015   Gastroesophageal reflux disease without  esophagitis 12/18/2014   H/O Helicobacter infection 12/18/2014   History of gastric ulcer 12/18/2014   Essential hypertension 03/04/2014   Social History   Tobacco Use   Smoking status: Never   Smokeless tobacco: Never  Substance Use Topics   Alcohol use: Never   Allergies  Allergen Reactions   Ibuprofen Nausea Only    Other reaction(s): Other (See Comments) GI issues   Codeine Nausea And Vomiting    Other reaction(s): Vomiting   Oxycodone-Acetaminophen Itching   Current Meds  Medication Sig   acetaminophen (TYLENOL) 500 MG tablet Take by mouth. Take 500 mg by mouth every 6 (six) hours as needed for Pain   albuterol (VENTOLIN HFA) 108 (90 Base) MCG/ACT inhaler Inhale 2 puffs into the lungs every 6 (six) hours as needed for wheezing or shortness of breath.   butalbital-acetaminophen-caffeine (FIORICET, ESGIC) 50-325-40 MG tablet Take by mouth.   cholecalciferol (VITAMIN D3) 25 MCG (1000 UNIT) tablet Take 1,000 Units by mouth daily.   CORLANOR 5 MG TABS tablet Take 5 mg by mouth daily.   Fluticasone-Umeclidin-Vilant (TRELEGY ELLIPTA) 200-62.5-25 MCG/ACT AEPB Inhale 1 puff into the lungs daily.   Folic Acid-Vit B6-Vit B12 (FOLBEE) 2.5-25-1 MG TABS tablet Take 1 tablet by mouth daily.   furosemide (LASIX) 20 MG tablet Take 20 mg by mouth daily as needed.   hydrOXYzine (ATARAX) 25 MG tablet Take 25 mg by mouth 3 (three) times daily as needed.   losartan-hydrochlorothiazide (HYZAAR) 100-12.5 MG  tablet Take 1 tablet by mouth daily.   methocarbamol (ROBAXIN) 750 MG tablet Take 750 mg by mouth 2 (two) times daily as needed.   Multiple Vitamin (MULTIVITAMIN ADULT PO) Take by mouth.   mupirocin ointment (BACTROBAN) 2 % Apply two times a day for 7 days.   topiramate (TOPAMAX) 50 MG tablet Take by mouth. Take 3 tablets (150 mg total) by mouth at bedtime Stop rx for 200mg    triamcinolone cream (KENALOG) 0.1 % Apply 1 application topically 2 (two) times daily.   Immunization History   Administered Date(s) Administered   Dtap, Unspecified 06/28/1967, 07/26/1967, 10/11/1967, 02/20/1975   Fluad Quad(high Dose 60+) 07/22/2022   Influenza,inj,Quad PF,6+ Mos 08/19/2016, 09/26/2017, 08/18/2021   Influenza-Unspecified 08/08/2013, 08/08/2013, 08/11/2014, 08/11/2014, 08/10/2015, 08/10/2015, 06/25/2019, 06/25/2019, 08/15/2020   MMR 08/26/2004, 03/02/2011   Moderna Sars-Covid-2 Vaccination 01/17/2020, 02/14/2020, 09/22/2020, 04/29/2021   PPD Test 01/01/2018   Pfizer Covid-19 Vaccine Bivalent Booster 24yrs & up 01/18/2022   Respiratory Syncytial Virus Vaccine,Recomb Aduvanted(Arexvy) 07/22/2022   Td (Adult),unspecified 06/25/2007, 02/15/2022   Tdap 01/01/2018, 02/15/2022   Zoster Recombinat (Shingrix) 04/14/2020, 11/23/2020       Objective:   Physical Exam BP 120/80 (BP Location: Right Arm, Cuff Size: Large)   Pulse 84   Temp (!) 97.1 F (36.2 C)   Ht 5\' 4"  (1.626 m)   Wt 194 lb (88 kg) Comment: Per patient.Paitent in a wheelchair  SpO2 97%   BMI 33.30 kg/m   SpO2: 97 % O2 Device: None (Room air)  GENERAL: Overweight woman, presents in transport chair.  Normally ambulates with assistance of cane.  No conversational dyspnea.  No acute distress. HEAD: Normocephalic, atraumatic.  EYES: Pupils equal, round, reactive to light.  No scleral icterus.  MOUTH: Oral mucosa moist.  No thrush.  Pharynx clear. NECK: Supple. No thyromegaly. Trachea midline. No JVD.  No adenopathy. PULMONARY: Good air entry bilaterally.  No adventitious sounds. CARDIOVASCULAR: S1 and S2. Regular rate and rhythm.  No rubs, murmurs or gallops heard.   ABDOMEN: Benign. MUSCULOSKELETAL: No joint deformity, no clubbing, mild with mild redness on the left lower extremity, very tender to touch.  No palpable cords. NEUROLOGIC: Grossly nonfocal.  Gait not tested. SKIN: Intact,warm,dry. PSYCH: Mood and behavior normal.  Lab Results  Component Value Date   NITRICOXIDE 14 11/17/2022       Assessment &  Plan:     ICD-10-CM   1. Reactive airways dysfunction syndrome (HCC)  J68.3    Take Trelegy daily Patient has been taking only 3 times a week Instructed on proper use    2. Shortness of breath  R06.02 Nitric oxide   No evidence of type II inflammation Pretty much at baseline    3. Cellulitis of left lower extremity  L03.116    Keflex 500 mg every 6h NEEDS TO SEE HER PRIMARY MD IN A.M. Per patient negative Doppler a week ago     Orders Placed This Encounter  Procedures   Nitric oxide   Meds ordered this encounter  Medications   cephALEXin (KEFLEX) 500 MG capsule    Sig: Take 1 capsule (500 mg total) by mouth 4 (four) times daily for 10 days.    Dispense:  40 capsule    Refill:  0   Patient was instructed to take the Trelegy daily.  Initiated therapy with Keflex for suspected cellulitis of the left lower extremity.  The patient is to see her primary physician in the morning.  Will see the patient in follow-up  in 3 months time she is to contact us prior to that time for any new difficulties arise.  Gailen Shelter, MD Advanced Bronchoscopy PCCM  [facility]   *This note was dictated using voice recognition software/Dragon.  Despite best efforts to proofread, errors can occur which can change the meaning. Any transcriptional errors that result from this process are unintentional and may not be fully corrected at the time of dictation.

## 2022-11-17 NOTE — Patient Instructions (Addendum)
Please make an appointment to see your primary care doctor tomorrow.  I have sent in a prescription for an antibiotic called Keflex (cephalexin this is 4 times a day.  I think you may have infection on the leg that is swollen.  You stated that they found no clot when they imaged it last week.  You need to have your primary doctor take care of this.  Your primary doctor can also evaluate where your back pain is coming from.  You do have mild asthma and you need to take your Trelegy daily.  The level of inflammation in your lungs was very low today which is good news.  See you in follow-up in 3 months time call sooner should any problems arise.

## 2023-02-16 ENCOUNTER — Ambulatory Visit: Payer: Medicare Other | Admitting: Pulmonary Disease

## 2023-02-27 ENCOUNTER — Encounter: Payer: Self-pay | Admitting: Primary Care

## 2023-02-27 ENCOUNTER — Ambulatory Visit (INDEPENDENT_AMBULATORY_CARE_PROVIDER_SITE_OTHER): Payer: Medicare HMO | Admitting: Primary Care

## 2023-02-27 VITALS — BP 134/62 | HR 103 | Ht 62.0 in | Wt 204.0 lb

## 2023-02-27 DIAGNOSIS — R058 Other specified cough: Secondary | ICD-10-CM | POA: Diagnosis not present

## 2023-02-27 DIAGNOSIS — J4530 Mild persistent asthma, uncomplicated: Secondary | ICD-10-CM

## 2023-02-27 DIAGNOSIS — K219 Gastro-esophageal reflux disease without esophagitis: Secondary | ICD-10-CM | POA: Diagnosis not present

## 2023-02-27 DIAGNOSIS — R490 Dysphonia: Secondary | ICD-10-CM | POA: Diagnosis not present

## 2023-02-27 DIAGNOSIS — J45909 Unspecified asthma, uncomplicated: Secondary | ICD-10-CM | POA: Insufficient documentation

## 2023-02-27 MED ORDER — FLUTICASONE PROPIONATE 50 MCG/ACT NA SUSP: 1.0000 | Freq: Every day | NASAL | 2 refills | Status: AC

## 2023-02-27 MED ORDER — ALBUTEROL SULFATE HFA 108 (90 BASE) MCG/ACT IN AERS: 2.0000 | INHALATION_SPRAY | Freq: Four times a day (QID) | RESPIRATORY_TRACT | 1 refills | Status: DC | PRN

## 2023-02-27 MED ORDER — MONTELUKAST SODIUM 10 MG PO TABS: 10.0000 mg | ORAL_TABLET | Freq: Every day | ORAL | 11 refills | Status: AC

## 2023-02-27 NOTE — Assessment & Plan Note (Addendum)
-   Patient has persistent dry cough and throat clearing, likely due to post nasal drip +/- GERD. Recommend starting Montelukast 10mg  at bedtime and fluticasone nasal spray. Continue PPI. If not better consider changing inhaler from dry powder to [facility]

## 2023-02-27 NOTE — Assessment & Plan Note (Addendum)
-   Hx H Pylori and gastric ulcer. No overt reflux symptoms except for cough/voice hoarseness. Patient is compliant with Prilosec 40mg  daily and GERD diet

## 2023-02-27 NOTE — Assessment & Plan Note (Signed)
-   Patient has dry cough along with voice hoarseness. Mostly likely cause is from PND symptoms and cough. Reflux could also be contributor. If not better with allergy medication consider trial off DPI and/or referral to ENT.

## 2023-02-27 NOTE — Assessment & Plan Note (Addendum)
-   Not acutely exacerbated. ACT 17. Lungs clear on exam. Continue Trelegy . Rx Albuterol hfa 2 puffs q4-6 hours prn sob/chest tightness.

## 2023-02-27 NOTE — Progress Notes (Signed)
Agree with the details of the visit as noted by Elizabeth Walsh, NP.  C. Laura Kendle Turbin, MD [facility] 

## 2023-02-27 NOTE — Patient Instructions (Addendum)
Cough triggered by post nasal drip, starting you on allergy medication and a nasal spray. Take these until your follow-up with Dr. Jayme Cloud. If not better we may change you from dry power inhaler to a different type   Recommendations: Start Singulair 10mg  at bedtime  Start fluticasone nasal spray daily  Continue Trelegy one puff daily in the morning (rinse mouth after use) Use Albuterol every 6 hours as needed for shortness of breath/chest tightness  Follow-up: 4-6 weeks with Dr. Jayme Cloud in [place]

## 2023-02-27 NOTE — Progress Notes (Signed)
 @Patient  ID: Valerie Hopkins, female    DOB: 1961/08/11, 62 y.o.   MRN: [id number]  Chief Complaint  Patient presents with   Follow-up    ACT 17    Referring provider: Kennis CarinaHarper, Sean S, MD  HPI: 62 year old female, never smoked.  Past medical history significant for tension, thoracic aortic aneurysm, active airway disease, GERD.  Patient of Dr. Jayme CloudGonzalez, last seen on 11/17/22.  02/27/2023 Patient presents today for 6410-month follow-up/asthma. Breathing is baseline. Maintained on Trelegy 200 mcg daily. She does not have a rescue inhaler. She has mild asthma symptoms, worse with seasonal allergies. Hard to breathing in the morning and at night. She has persistent dry cough, clears her throat frequently. Associated voice hoarseness. She has very little chest tightness or wheezing.    Allergies  Allergen Reactions   Naproxen Other (See Comments)   Ibuprofen Nausea Only    Other reaction(s): Other (See Comments) GI issues   Ciprofloxacin Swelling   Other Itching   Codeine Nausea And Vomiting    Other reaction(s): Vomiting   Lisinopril Cough   Oxycodone-Acetaminophen Itching    Immunization History  Administered Date(s) Administered   Dtap, Unspecified 06/28/1967, 07/26/1967, 10/11/1967, 02/20/1975   Fluad Quad(high Dose 65+) 07/22/2022   Influenza,inj,Quad PF,6+ Mos 08/19/2016, 09/26/2017, 08/18/2021   Influenza-Unspecified 08/08/2013, 08/08/2013, 08/11/2014, 08/11/2014, 08/10/2015, 08/10/2015, 06/25/2019, 06/25/2019, 08/15/2020   MMR 08/26/2004, 03/02/2011   Moderna Sars-Covid-2 Vaccination 01/17/2020, 02/14/2020, 09/22/2020, 04/29/2021   PPD Test 01/01/2018   Pfizer Covid-19 Vaccine Bivalent Booster 6034yrs & up 01/18/2022   Polio, Unspecified 06/28/1967, 10/11/1967, 02/20/1975   Respiratory Syncytial Virus Vaccine,Recomb Aduvanted(Arexvy) 07/22/2022   Td (Adult),unspecified 06/25/2007, 02/15/2022   Tdap 11/21/2010, 01/01/2018, 02/15/2022   Zoster Recombinat (Shingrix) 04/14/2020,  11/23/2020    Past Medical History:  Diagnosis Date   Hypertension    Migraine     Tobacco History: Social History   Tobacco Use  Smoking Status Never  Smokeless Tobacco Never   Counseling given: Not Answered   Outpatient Medications Prior to Visit  Medication Sig Dispense Refill   acetaminophen (TYLENOL) 500 MG tablet Take by mouth. Take 500 mg by mouth every 6 (six) hours as needed for Pain     butalbital-acetaminophen-caffeine (FIORICET, ESGIC) 50-325-40 MG tablet Take by mouth.     cholecalciferol (VITAMIN D3) 25 MCG (1000 UNIT) tablet Take 1,000 Units by mouth daily.     CORLANOR 5 MG TABS tablet Take 5 mg by mouth daily.     Fluticasone-Umeclidin-Vilant (TRELEGY ELLIPTA) 200-62.5-25 MCG/ACT AEPB Inhale 1 puff into the lungs daily.     Folic Acid-Vit B6-Vit B12 (FOLBEE) 2.5-25-1 MG TABS tablet Take 1 tablet by mouth daily.     furosemide (LASIX) 20 MG tablet Take 20 mg by mouth daily as needed.     hydrOXYzine (ATARAX) 25 MG tablet Take 25 mg by mouth 3 (three) times daily as needed.     lidocaine (LIDODERM) 5 % Place onto the skin.     losartan-hydrochlorothiazide (HYZAAR) 100-12.5 MG tablet Take 1 tablet by mouth daily.     methocarbamol (ROBAXIN) 750 MG tablet Take 750 mg by mouth 2 (two) times daily as needed.     Multiple Vitamin (MULTIVITAMIN ADULT PO) Take by mouth.     mupirocin ointment (BACTROBAN) 2 % Apply two times a day for 7 days. 22 g 0   topiramate (TOPAMAX) 50 MG tablet Take by mouth. Take 3 tablets (150 mg total) by mouth at bedtime Stop rx for 200mg   triamcinolone cream (KENALOG) 0.1 % Apply 1 application topically 2 (two) times daily. 30 g 0   metoprolol tartrate (LOPRESSOR) 100 MG tablet Take 1 tablet (100 mg total) by mouth once for 1 dose. Please take one time dose 100mg  metoprolol tartrate 2 hr prior to cardiac CT for HR control IF HR >55bpm. 1 tablet 0   omeprazole (PRILOSEC) 40 MG capsule Take by mouth.     No facility-administered medications  prior to visit.    Review of Systems  Review of Systems  Constitutional: Negative.   HENT: Negative.    Respiratory:  Positive for cough. Negative for chest tightness and wheezing.   Cardiovascular: Negative.     Physical Exam  BP 134/62 (BP Location: Left Arm, Patient Position: Sitting, Cuff Size: Large)   Pulse (!) 103   Ht 5\' 2"  (1.575 m)   Wt 204 lb (92.5 kg)   SpO2 99%   BMI 37.31 kg/m  Physical Exam Constitutional:      General: She is not in acute distress.    Appearance: Normal appearance. She is not ill-appearing.  HENT:     Head: Normocephalic and atraumatic.     Mouth/Throat:     Mouth: Mucous membranes are moist.     Pharynx: Oropharynx is clear.  Cardiovascular:     Rate and Rhythm: Normal rate and regular rhythm.  Pulmonary:     Effort: Pulmonary effort is normal.     Breath sounds: Normal breath sounds.     Comments: Persistent throat clearing t/o visit  Skin:    General: Skin is warm and dry.  Neurological:     General: No focal deficit present.     Mental Status: She is alert and oriented to person, place, and time. Mental status is at baseline.  Psychiatric:        Mood and Affect: Mood normal.        Behavior: Behavior normal.        Thought Content: Thought content normal.        Judgment: Judgment normal.      Lab Results:  CBC    Component Value Date/Time   WBC 5.6 06/07/2022 1905   RBC 4.85 06/07/2022 1905   HGB 13.4 06/07/2022 1905   HCT 43.6 06/07/2022 1905   PLT 267 06/07/2022 1905   MCV 89.9 06/07/2022 1905   MCH 27.6 06/07/2022 1905   MCHC 30.7 06/07/2022 1905   RDW 12.8 06/07/2022 1905    BMET    Component Value Date/Time   NA 136 06/07/2022 1905   K 3.1 (L) 06/07/2022 1905   CL 107 06/07/2022 1905   CO2 21 (L) 06/07/2022 1905   GLUCOSE 213 (H) 06/07/2022 1905   BUN 21 (H) 06/07/2022 1905   CREATININE 1.02 (H) 06/07/2022 1905   CALCIUM 9.1 06/07/2022 1905   GFRNONAA >60 06/07/2022 1905    BNP No results found  for: "BNP"  ProBNP No results found for: "PROBNP"  Imaging: No results found.   Assessment & Plan:   Asthma - Not acutely exacerbated. ACT 17. Lungs clear on exam. Continue Trelegy . Rx Albuterol hfa 2 puffs q4-6 hours prn sob/chest tightness.   Upper airway cough syndrome - Patient has persistent dry cough and throat clearing, likely due to post nasal drip +/- GERD. Recommend starting Montelukast 10mg  at bedtime and fluticasone nasal spray. Continue PPI. If not better consider changing inhaler from dry powder to [facility]   Voice hoarseness - Patient has dry cough along  with voice hoarseness. Mostly likely cause is from PND symptoms and cough. Reflux could also be contributor. If not better with allergy medication consider trial off DPI and/or referral to ENT.   Gastroesophageal reflux disease without esophagitis - Hx H Pylori and gastric ulcer. No overt reflux symptoms except for cough/voice hoarseness. Patient is compliant with Prilosec 40mg  daily and GERD diet    Glenford Bayley, NP 02/27/2023

## 2023-05-26 ENCOUNTER — Encounter: Payer: Self-pay | Admitting: Pulmonary Disease

## 2023-06-21 ENCOUNTER — Ambulatory Visit: Payer: Medicare HMO | Admitting: Pulmonary Disease

## 2023-06-21 ENCOUNTER — Encounter: Payer: Self-pay | Admitting: Pulmonary Disease

## 2023-06-21 ENCOUNTER — Telehealth: Payer: Self-pay

## 2023-06-21 VITALS — BP 128/84 | HR 56 | Temp 97.9°F | Ht 62.0 in | Wt 207.8 lb

## 2023-06-21 DIAGNOSIS — R058 Other specified cough: Secondary | ICD-10-CM | POA: Diagnosis not present

## 2023-06-21 DIAGNOSIS — Z6838 Body mass index (BMI) 38.0-38.9, adult: Secondary | ICD-10-CM

## 2023-06-21 DIAGNOSIS — R490 Dysphonia: Secondary | ICD-10-CM | POA: Diagnosis not present

## 2023-06-21 DIAGNOSIS — J4530 Mild persistent asthma, uncomplicated: Secondary | ICD-10-CM | POA: Diagnosis not present

## 2023-06-21 DIAGNOSIS — I878 Other specified disorders of veins: Secondary | ICD-10-CM

## 2023-06-21 DIAGNOSIS — K219 Gastro-esophageal reflux disease without esophagitis: Secondary | ICD-10-CM | POA: Diagnosis not present

## 2023-06-21 DIAGNOSIS — E669 Obesity, unspecified: Secondary | ICD-10-CM

## 2023-06-21 LAB — NITRIC OXIDE: Nitric Oxide: 17

## 2023-06-21 MED ORDER — TRELEGY ELLIPTA 200-62.5-25 MCG/ACT IN AEPB: 1.0000 | INHALATION_SPRAY | Freq: Every day | RESPIRATORY_TRACT | Status: DC

## 2023-06-21 MED ORDER — ALBUTEROL SULFATE 1.25 MG/3ML IN NEBU: 1.0000 | INHALATION_SOLUTION | Freq: Four times a day (QID) | RESPIRATORY_TRACT | 12 refills | Status: DC | PRN

## 2023-06-21 NOTE — Telephone Encounter (Signed)
Patient was seen in the office today and given [employer] assistance forms to fill out. She will return them to the office once she has completed them.  Holding message to confirm completion.

## 2023-06-21 NOTE — Patient Instructions (Signed)
We will send in a new prescription for medication for your nebulizer to use as needed.  We provided you with Trelegy samples.  We provided you with forms to fill out to see if we can get you assistance for Trelegy.  Discussed with your primary physician and/or your neurologist with regards to your neck issues.  Take montelukast (Singulair) daily.  We will see you in follow-up in 4 months call sooner should any new problems arise.

## 2023-06-21 NOTE — Progress Notes (Signed)
Subjective:    Patient ID: Valerie Hopkins, female    DOB: [birth year], 61 y.o.   MRN: [id number]  Patient Care Team: Kennis Carina, MD as PCP - General (Family Medicine) Salena Saner, MD as Consulting Physician (Pulmonary Disease)  Chief Complaint  Patient presents with   Follow-up    Covid 2 weeks ago. Hoarseness. DOE. Cough with clear or yellow sputum. No wheezing.    HPI Patient is a 62 year old lifelong never smoker who presents here for the issue of a lung nodule and reactive airways disease/asthma.  She presents today for a scheduled follow-up visit, last seen here on 27 February 2023 by Ames Dura, NP.  She is recovering from COVID which she had 2 weeks ago.  She has no change in her dyspnea on exertion however, no worsening.  Has had no wheezing.  Cough productive of clear sputum on occasion mostly after her COVID diagnosis.  She is on Trelegy Ellipta 200 which she notes gives her good relief of symptoms.  Her cough is mostly driven by postnasal drip and nasal congestion, she is only taking Singulair as needed and not daily as prescribed.  Gastroesophageal reflux symptoms are controlled with antireflux measures and omeprazole. She has not had any fevers chills or sweats.  She has had chronic issues with neck discomfort, has not discussed this with her primary care physician.  No headaches, no visual disturbance, no radiation to arms.   Review of Systems A 10 point review of systems was performed and it is as noted above otherwise negative.   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   Chronic migraine 04/14/2020   Left cervical radiculopathy 04/30/2019   Left-sided low back pain with sciatica 02/04/2019   Left elbow pain  01/16/2017   Cervical spondylosis without myelopathy 02/10/2016   Metatarsalgia of both feet 02/02/2015   Gastroesophageal reflux disease without esophagitis 12/18/2014   H/O Helicobacter infection 12/18/2014   History of gastric ulcer 12/18/2014   Essential hypertension 03/04/2014    Social History   Tobacco Use   Smoking status: Never   Smokeless tobacco: Never  Substance Use Topics   Alcohol use: Never    Allergies  Allergen Reactions   Naproxen Other (See Comments)   Ibuprofen Nausea Only    Other reaction(s): Other (See Comments) GI issues   Ciprofloxacin Swelling   Other Itching   Codeine Nausea And Vomiting    Other reaction(s): Vomiting   Lisinopril Cough   Oxycodone-Acetaminophen Itching    Current Meds  Medication Sig   acetaminophen (TYLENOL) 500 MG tablet Take by mouth. Take 500 mg by mouth every 6 (six) hours as needed for Pain   albuterol (VENTOLIN HFA) 108 (90 Base) MCG/ACT inhaler Inhale 2 puffs into the lungs every 6 (six) hours as needed for wheezing or shortness of breath.   cholecalciferol (VITAMIN D3) 25 MCG (1000 UNIT) tablet Take 1,000 Units by mouth daily.   fluticasone (FLONASE) 50 MCG/ACT nasal spray Place 1 spray into both nostrils daily.   Fluticasone-Umeclidin-Vilant (TRELEGY ELLIPTA) 200-62.5-25 MCG/ACT AEPB Inhale 1 puff into the lungs daily.   Folic Acid-Vit B6-Vit B12 (FOLBEE) 2.5-25-1 MG TABS tablet Take 1 tablet by mouth daily.   furosemide (LASIX) 20 MG tablet Take 20 mg by  mouth daily as needed.   gabapentin (NEURONTIN) 600 MG tablet Take 600 mg by mouth daily.   losartan-hydrochlorothiazide (HYZAAR) 100-12.5 MG tablet Take 1 tablet by mouth daily.   methocarbamol (ROBAXIN) 750 MG tablet Take 750 mg by mouth 2 (two) times daily as needed.   montelukast (SINGULAIR) 10 MG tablet Take 1 tablet (10 mg total) by mouth at bedtime.   Multiple Vitamin (MULTIVITAMIN ADULT PO) Take by mouth.   mupirocin ointment (BACTROBAN) 2 % Apply two times  a day for 7 days.   naproxen (NAPROSYN) 500 MG tablet Take 500 mg by mouth 2 (two) times daily.   ondansetron (ZOFRAN) 4 MG tablet Take 4 mg by mouth every 8 (eight) hours as needed.   pantoprazole (PROTONIX) 40 MG tablet Take 40 mg by mouth daily.   SUMAtriptan (IMITREX) 25 MG tablet Take 25 mg by mouth as needed.   topiramate (TOPAMAX) 50 MG tablet Take by mouth. Take 3 tablets (150 mg total) by mouth at bedtime Stop rx for 200mg    triamcinolone cream (KENALOG) 0.1 % Apply 1 application topically 2 (two) times daily.   [DISCONTINUED] ipratropium-albuterol (DUONEB) 0.5-2.5 (3) MG/3ML SOLN Inhale 3 mLs into the lungs as needed.    Immunization History  Administered Date(s) Administered   Dtap, Unspecified 06/28/1967, 07/26/1967, 10/11/1967, 02/20/1975   Fluad Quad(high Dose 65+) 07/22/2022   Influenza,inj,Quad PF,6+ Mos 08/19/2016, 09/26/2017, 08/18/2021   Influenza-Unspecified 08/08/2013, 08/08/2013, 08/11/2014, 08/11/2014, 08/10/2015, 08/10/2015, 06/25/2019, 06/25/2019, 08/15/2020   MMR 08/26/2004, 03/02/2011   Moderna Sars-Covid-2 Vaccination 01/17/2020, 02/14/2020, 09/22/2020, 04/29/2021   PPD Test 01/01/2018   Pfizer Covid-19 Vaccine Bivalent Booster 58yrs & up 01/18/2022   Polio, Unspecified 06/28/1967, 10/11/1967, 02/20/1975   Respiratory Syncytial Virus Vaccine,Recomb Aduvanted(Arexvy) 07/22/2022   Td (Adult),unspecified 06/25/2007, 02/15/2022   Tdap 11/21/2010, 01/01/2018, 02/15/2022   Zoster Recombinant(Shingrix) 04/14/2020, 11/23/2020        Objective:     BP 128/84 (BP Location: Left Arm, Cuff Size: Large)   Pulse (!) 56   Temp 97.9 F (36.6 C)   Ht 5\' 2"  (1.575 m)   Wt 207 lb 12.8 oz (94.3 kg)   SpO2 100%   BMI 38.01 kg/m   SpO2: 100 % O2 Device: None (Room air)  GENERAL: Overweight woman, presents in transport chair.  Normally ambulates with assistance of cane.  No conversational dyspnea.  No acute distress. HEAD: Normocephalic, atraumatic.  EYES: Pupils  equal, round, reactive to light.  No scleral icterus.  MOUTH: Oral mucosa moist.  No thrush.  Pharynx clear. NECK: Supple. No thyromegaly. Trachea midline. No JVD.  No adenopathy. PULMONARY: Good air entry bilaterally.  No adventitious sounds. CARDIOVASCULAR: S1 and S2. Regular rate and rhythm.  No rubs, murmurs or gallops heard.   ABDOMEN: Benign. MUSCULOSKELETAL: No joint deformity, no clubbing, 2+ edema left lower extremity compared to right, no redness or warmth.  No palpable cords. NEUROLOGIC: Grossly nonfocal.  Gait not tested. SKIN: Intact,warm,dry.  Venous stasis changes on the left lower extremity PSYCH: Mood and behavior normal.  Lab Results  Component Value Date   NITRICOXIDE 17 06/21/2023     Assessment & Plan:     ICD-10-CM   1. Mild persistent asthma without complication  J45.30 Nitric oxide   Continue Trelegy 200 Continue as needed albuterol AccuNeb solution for nebulizer as needed    2. Upper airway cough syndrome  R05.8    Take Singulair daily Nasal hygiene    3. Voice hoarseness  R49.0    Residual after COVID  2 weeks ago States improving    4. Gastroesophageal reflux disease without esophagitis  K21.9    Continue antireflux measures Continue PPI    5. Venous stasis of lower extremity - LEFT  I87.8    Continue compression sleeve Managed by primary MD    6. Class 2 obesity with body mass index (BMI) of 38.0 to 38.9 in adult, unspecified obesity type, unspecified whether serious comorbidity present  E66.9    Z68.38    Lets complexity to her management Recommend weight loss      Orders Placed This Encounter  Procedures   Nitric oxide    Meds ordered this encounter  Medications   Fluticasone-Umeclidin-Vilant (TRELEGY ELLIPTA) 200-62.5-25 MCG/ACT AEPB    Sig: Inhale 1 puff into the lungs daily.    Order Specific Question:   Lot Number?    Answer:   [id number]    Order Specific Question:   Expiration Date?    Answer:   11/21/2024    Order Specific  Question:   Quantity    Answer:   2   albuterol (ACCUNEB) 1.25 MG/3ML nebulizer solution    Sig: Take 3 mLs (1.25 mg total) by nebulization every 6 (six) hours as needed for wheezing.    Dispense:  87 mL    Refill:  12   The patient was encouraged to discuss issues with her neck discomfort with primary MD.  These are actually chronic issues.  Will see the patient in follow-up in 4 months time she is to contact us prior to that time should any new difficulties arise.  Gailen Shelter, MD Advanced Bronchoscopy PCCM [facility]    *This note was dictated using voice recognition software/Dragon.  Despite best efforts to proofread, errors can occur which can change the meaning. Any transcriptional errors that result from this process are unintentional and may not be fully corrected at the time of dictation.

## 2023-06-26 NOTE — Telephone Encounter (Signed)
Spoke to patient. She stated that she has not had time to complete forms, as she has been in the ED with stomach issues. She will bring forms by our office once completed.

## 2023-07-07 NOTE — Telephone Encounter (Signed)
I spoke the patient this morning. When I asked about the paperwork she said she has not had time to think about it. She started to cry and say she has been having a really hard time after her Colonoscopy at [facility]. She said "they hurt me." She said she needs someone to talk to but she does not want to talk to them. She said she does not know what to do with herself. I asked if she has reached out to the PCP and she said she has an appt with them at the end of the month. She said Clydie Braun from [employer] reached out to her but did not have time to talk to her about her problems. She was very upset about this. I tried to contact Clydie Braun but it went to voicemail and her voicemail box was full. I did reach out to her PCP office [facility]. I spoke with Thereasa Distance. He took a message and sent it back to the patient's care team to have them reach out to her ASAP. I reached back out to the patient. She said last night she was bleeding from her rectum. I advised her to go to the ED or contact her PCP, ASAP. She said she will reach out to her PCP.  I will follow up with the paperwork at her next visit. Nothing further needed.

## 2023-12-14 ENCOUNTER — Other Ambulatory Visit: Payer: Self-pay | Admitting: Internal Medicine

## 2023-12-14 ENCOUNTER — Encounter: Payer: Self-pay | Admitting: Pulmonary Disease

## 2023-12-14 ENCOUNTER — Ambulatory Visit: Payer: Medicare HMO | Admitting: Pulmonary Disease

## 2023-12-14 VITALS — BP 124/84 | HR 60 | Temp 97.3°F | Ht 62.0 in | Wt 208.6 lb

## 2023-12-14 DIAGNOSIS — R079 Chest pain, unspecified: Secondary | ICD-10-CM

## 2023-12-14 DIAGNOSIS — J45909 Unspecified asthma, uncomplicated: Secondary | ICD-10-CM | POA: Diagnosis not present

## 2023-12-14 DIAGNOSIS — B370 Candidal stomatitis: Secondary | ICD-10-CM | POA: Diagnosis not present

## 2023-12-14 DIAGNOSIS — K219 Gastro-esophageal reflux disease without esophagitis: Secondary | ICD-10-CM

## 2023-12-14 DIAGNOSIS — Z9189 Other specified personal risk factors, not elsewhere classified: Secondary | ICD-10-CM | POA: Diagnosis not present

## 2023-12-14 DIAGNOSIS — K2100 Gastro-esophageal reflux disease with esophagitis, without bleeding: Secondary | ICD-10-CM

## 2023-12-14 LAB — NITRIC OXIDE: Nitric Oxide: 17

## 2023-12-14 MED ORDER — FLUCONAZOLE 200 MG PO TABS: 200.0000 mg | ORAL_TABLET | Freq: Every day | ORAL | 0 refills | Status: AC

## 2023-12-14 MED ORDER — BREZTRI AEROSPHERE 160-9-4.8 MCG/ACT IN AERO: 2.0000 | INHALATION_SPRAY | Freq: Two times a day (BID) | RESPIRATORY_TRACT | 11 refills | Status: AC

## 2023-12-14 MED ORDER — BREZTRI AEROSPHERE 160-9-4.8 MCG/ACT IN AERO: 2.0000 | INHALATION_SPRAY | Freq: Two times a day (BID) | RESPIRATORY_TRACT | Status: DC

## 2023-12-14 MED ORDER — ALBUTEROL SULFATE HFA 108 (90 BASE) MCG/ACT IN AERS: 2.0000 | INHALATION_SPRAY | Freq: Four times a day (QID) | RESPIRATORY_TRACT | 2 refills | Status: DC | PRN

## 2023-12-14 MED ORDER — PANTOPRAZOLE SODIUM 40 MG PO TBEC: 40.0000 mg | DELAYED_RELEASE_TABLET | Freq: Two times a day (BID) | ORAL | 1 refills | Status: AC

## 2023-12-14 NOTE — Patient Instructions (Signed)
VISIT SUMMARY:  You came in today because of chest pain and shortness of breath. We discussed your current medications and symptoms, including issues with reflux and throat clearing. We also reviewed your asthma management and the need for further evaluation of your sleep apnea.  YOUR PLAN:  -CHEST PAIN: Your chest pain may be due to overusing Trelegy, which you have been taking twice daily instead of once. It could also be related to your reflux or other heart issues that Dr. Juliann Pares is evaluating. We will switch you to [facility], which should help control your symptoms better. You should also use a rescue inhaler for sudden symptoms and increase your Protonix to twice daily to help with reflux. We will schedule a home sleep study to check for sleep apnea.  -ASTHMA: Your asthma symptoms, like shortness of breath and chest tightness, may be worsening because of improper use of Trelegy. We will switch you to [facility], which should help control your symptoms better. You should also use a rescue inhaler for sudden symptoms.  The level of inflammation in your airway is low after our check on it today.  -ORAL AND ESOPHAGEAL CANDIDIASIS: You likely have a fungal infection in your mouth and throat due to overusing Trelegy. This can cause throat clearing and a coated tongue. We will prescribe an antifungal medication for 14 days to treat this.  -GASTROESOPHAGEAL REFLUX DISEASE (GERD): Your reflux may be contributing to your chest pain. We will increase your Protonix to twice daily to help control your symptoms better.  -OBSTRUCTIVE SLEEP APNEA: You have untreated sleep apnea, which can cause significant sleepiness and other health issues. We will schedule a home sleep study to get an accurate diagnosis.  INSTRUCTIONS:  Please follow up in 3-4 weeks and complete the tests ordered by Dr. Juliann Pares.

## 2023-12-14 NOTE — Progress Notes (Signed)
Subjective:    Patient ID: Valerie Hopkins, female    DOB: 1961/06/11, 63 y.o.   MRN: [id number]  Patient Care Team: Kennis Carina, MD as PCP - General (Family Medicine) Salena Saner, MD as Consulting Physician (Pulmonary Disease)  Chief Complaint  Patient presents with   Acute Visit    Chest pain wakes her up at night and gasping for air. Chest pain during the day as well. DOE. Symptoms for 1 week. Has been to the ED and Cardiology.     BACKGROUND/INTERVAL: Patient is a 63 year old lifelong never smoker who presents today as a ACUTE VISIT due to issues with intermittent dyspnea and chest pain.  Of note the patient was seen in the emergency room at [facility] on 16 January with an extensive negative workup to include CT angio chest.  She was seen by Dr. Juliann Pares, her cardiologist on 21 January with orders for echocardiogram and coronary CT. Dr. Juliann Pares suggested that she see pulmonary for potential sleep apnea.  The patient was last seen here on 21 June 2023.  HPI Discussed the use of AI scribe software for clinical note transcription with the patient, who gave verbal consent to proceed.  History of Present Illness   The patient, with a history of asthma and untreated obstructive sleep apnea, presents for an acute visit due to chest pain and shortness of breath. She reports using Trelegy twice a day for about a month, which she believes has helped her symptoms. However, she also notes waking up with chest pain.  She increased the dosage on her own.  She was advised that she is OVERDOSING with the medication.  She also notes hoarseness and constant throat clearing.  She has not been rinsing appropriately after Trelegy use.  In addition to her respiratory symptoms, the patient also reports problems with reflux. She has been taking medication for this condition once a day, but the symptoms persist.  However, upon careful review of her chart I do not see a recent active prescription  for PPI.  The patient describes her chest pain as a sensation of everything freezing and a need to get more air.  The patient also reports a throat clearing issue, during the visit she is noted clearing her throat constantly. She has not been using a rescue inhaler recently, as she has run out of it.  She has noted some intermittent dysphagia.  She has not had any fevers, chills or sweats.  No cough or sputum production.  No hemoptysis.  Does not endorse orthopnea per se.  The patient is also due for several tests ordered by her cardiologist, Dr. Juliann Pares.  This included a coronary CT and an echocardiogram.  I have reviewed the records from her visit at the ED in [facility] and most recent visit with Dr. Juliann Pares.   Review of Systems A 10 point review of systems was performed and it is as noted above otherwise negative.   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   Chronic migraine 04/14/2020   Left cervical radiculopathy 04/30/2019   Left-sided low back pain with sciatica 02/04/2019   Left elbow pain 01/16/2017   Cervical spondylosis without myelopathy 02/10/2016   Metatarsalgia of both feet 02/02/2015   Gastroesophageal reflux disease without esophagitis  12/18/2014   H/O Helicobacter infection 12/18/2014   History of gastric ulcer 12/18/2014   Essential hypertension 03/04/2014    Social History   Tobacco Use   Smoking status: Never   Smokeless tobacco: Never  Substance Use Topics   Alcohol use: Never    Allergies  Allergen Reactions   Naproxen Other (See Comments)   Ibuprofen Nausea Only    Other reaction(s): Other (See Comments) GI issues   Ciprofloxacin Swelling   Other Itching   Codeine Nausea And Vomiting    Other  reaction(s): Vomiting   Lisinopril Cough   Oxycodone-Acetaminophen Itching    Current Meds  Medication Sig   acetaminophen (TYLENOL) 500 MG tablet Take by mouth. Take 500 mg by mouth every 6 (six) hours as needed for Pain   cholecalciferol (VITAMIN D3) 25 MCG (1000 UNIT) tablet Take 1,000 Units by mouth daily.   fluticasone (FLONASE) 50 MCG/ACT nasal spray Place 1 spray into both nostrils daily.   Fluticasone-Umeclidin-Vilant (TRELEGY ELLIPTA) 200-62.5-25 MCG/ACT AEPB Inhale 1 puff into the lungs daily.   Folic Acid-Vit B6-Vit B12 (FOLBEE) 2.5-25-1 MG TABS tablet Take 1 tablet by mouth daily.   furosemide (LASIX) 20 MG tablet Take 20 mg by mouth daily as needed.   gabapentin (NEURONTIN) 600 MG tablet Take 600 mg by mouth daily.   isosorbide mononitrate (IMDUR) 30 MG 24 hr tablet Take 30 mg by mouth daily.   losartan-hydrochlorothiazide (HYZAAR) 100-12.5 MG tablet Take 1 tablet by mouth daily.   methocarbamol (ROBAXIN) 750 MG tablet Take 750 mg by mouth 2 (two) times daily as needed.   Multiple Vitamin (MULTIVITAMIN ADULT PO) Take by mouth.   mupirocin ointment (BACTROBAN) 2 % Apply two times a day for 7 days.   topiramate (TOPAMAX) 50 MG tablet Take by mouth. Take 3 tablets (150 mg total) by mouth at bedtime Stop rx for 200mg    triamcinolone cream (KENALOG) 0.1 % Apply 1 application topically 2 (two) times daily.   [DISCONTINUED] albuterol (VENTOLIN HFA) 108 (90 Base) MCG/ACT inhaler Inhale 2 puffs into the lungs every 6 (six) hours as needed for wheezing or shortness of breath.   [DISCONTINUED] pantoprazole (PROTONIX) 40 MG tablet Take 40 mg by mouth daily.    Immunization History  Administered Date(s) Administered   Dtap, Unspecified 06/28/1967, 07/26/1967, 10/11/1967, 02/20/1975   Fluad Quad(high Dose 65+) 07/22/2022   Influenza,inj,Quad PF,6+ Mos 08/19/2016, 09/26/2017, 08/18/2021   Influenza-Unspecified 08/08/2013, 08/08/2013, 08/11/2014, 08/11/2014, 08/10/2015, 08/10/2015,  06/25/2019, 06/25/2019, 08/15/2020, 07/23/2023   MMR 08/26/2004, 03/02/2011   Moderna Sars-Covid-2 Vaccination 01/17/2020, 02/14/2020, 09/22/2020, 04/29/2021   PPD Test 01/01/2018   Pfizer Covid-19 Vaccine Bivalent Booster 66yrs & up 01/18/2022   Polio, Unspecified 06/28/1967, 10/11/1967, 02/20/1975   Respiratory Syncytial Virus Vaccine,Recomb Aduvanted(Arexvy) 07/22/2022   Td (Adult),unspecified 06/25/2007, 02/15/2022   Tdap 11/21/2010, 01/01/2018, 02/15/2022   Zoster Recombinant(Shingrix) 04/14/2020, 11/23/2020        Objective:     BP 124/84 (BP Location: Right Arm, Cuff Size: Large)   Pulse 60   Temp (!) 97.3 F (36.3 C)   Ht 5\' 2"  (1.575 m)   Wt 208 lb 9.6 oz (94.6 kg)   SpO2 98%   BMI 38.15 kg/m   SpO2: 98 %  GENERAL: Overweight woman, presents in transport chair.  Normally ambulates with assistance of cane.  No conversational dyspnea.  No acute distress. HEAD: Normocephalic, atraumatic.  EYES: Pupils equal, round, reactive to light.  No scleral icterus.  MOUTH: Oral mucosa moist.  + Oropharyngeal thrush. NECK:  Supple. No thyromegaly. Trachea midline. No JVD.  No adenopathy. PULMONARY: Good air entry bilaterally.  No adventitious sounds. CARDIOVASCULAR: S1 and S2. Regular rate and rhythm.  No rubs, murmurs or gallops heard.   ABDOMEN: Benign. MUSCULOSKELETAL: No joint deformity, no clubbing, 2+ edema left lower extremity compared to right, no redness or warmth.  No palpable cords. NEUROLOGIC: Grossly nonfocal.  Gait not tested. SKIN: Intact,warm,dry.  Venous stasis changes on the left lower extremity PSYCH: Mood and behavior normal.  Lab Results  Component Value Date   NITRICOXIDE 17 12/14/2023  *No evidence of type II inflammation      12/14/2023   11:00 AM  Results of the Epworth flowsheet  Sitting and reading 3  Watching TV 2  Sitting, inactive in a public place (e.g. a theatre or a meeting) 2  As a passenger in a car for an hour without a break 3  Lying  down to rest in the afternoon when circumstances permit 2  Sitting and talking to someone 1  Sitting quietly after a lunch without alcohol 3  In a car, while stopped for a few minutes in traffic 2  Total score 18     Assessment & Plan:     ICD-10-CM   1. Persistent asthma without complication, unspecified asthma severity  J45.909     2. Gastroesophageal reflux disease without esophagitis  K21.9     3. Oropharyngeal candidiasis  B37.0    Query esophageal as well    4. At risk for obstructive sleep apnea  Z91.89       Orders Placed This Encounter  Procedures   Nitric oxide   Home sleep test    Standing Status:   Future    Expiration Date:   12/13/2024    Where should this test be performed::   [facility] - Pulmonary    Meds ordered this encounter  Medications   fluconazole (DIFLUCAN) 200 MG tablet    Sig: Take 1 tablet (200 mg total) by mouth daily for 14 days.    Dispense:  14 tablet    Refill:  0   pantoprazole (PROTONIX) 40 MG tablet    Sig: Take 1 tablet (40 mg total) by mouth 2 (two) times daily before a meal.    Dispense:  60 tablet    Refill:  1   Budeson-Glycopyrrol-Formoterol (BREZTRI AEROSPHERE) 160-9-4.8 MCG/ACT AERO    Sig: Inhale 2 puffs into the lungs in the morning and at bedtime.    Dispense:  10.7 g    Refill:  11   albuterol (VENTOLIN HFA) 108 (90 Base) MCG/ACT inhaler    Sig: Inhale 2 puffs into the lungs every 6 (six) hours as needed for wheezing or shortness of breath.    Dispense:  8 g    Refill:  2   Budeson-Glycopyrrol-Formoterol (BREZTRI AEROSPHERE) 160-9-4.8 MCG/ACT AERO    Sig: Inhale 2 puffs into the lungs in the morning and at bedtime.    Dispense:  5.9 g    Lot Number?:   [id number]    Expiration Date?:   12/22/2025    Manufacturer?:   [employer] [[id number]]    Quantity:   1   Discussion:    Chest Pain Intermittent chest pain likely exacerbated by overuse of Trelegy (used twice daily instead of once daily). Potential contribution from GERD and  possible cardiac issues being evaluated by Dr. Juliann Pares. Risks of overusing Trelegy include increased chest pain and potential cardiac effects. Benefits of switching to [facility] include  better symptom control with appropriate dosing. Discussed the importance of using the rescue inhaler for acute symptoms. - Discontinue Trelegy - Prescribe Breztri twice daily - Prescribe rescue inhaler - Increase Protonix to twice daily (unsure if the patient is actually using this, prescription rewritten) - Schedule home sleep study - Follow up in 3-4 weeks  Asthma Asthma exacerbation likely due to improper use of Trelegy. Symptoms include shortness of breath and chest tightness. Risks of improper use include exacerbation of asthma symptoms and potential cardiac effects. Benefits of Breztri include better symptom control with appropriate dosing. Discussed the importance of using the rescue inhaler for acute symptoms. - Discontinue Trelegy - Prescribe Breztri twice daily - Prescribe rescue inhaler - Check level of airway inflammation (FeNO 17 ppb today)  Oral and Esophageal Candidiasis Likely oral and esophageal thrush due to overuse of Trelegy. Symptoms include throat clearing and coated tongue. Risks of untreated thrush include persistent symptoms and potential spread to the esophagus. Benefits of antifungal treatment include resolution of symptoms. - Prescribe antifungal medication for 14 days (Diflucan 200 mg daily x 14 days)  Gastroesophageal Reflux Disease (GERD) GERD with current symptoms potentially contributing to chest pain. Currently on Protonix once daily (unsure if the patient is actually taking this). Risks of untreated GERD include persistent symptoms and potential esophageal damage. Benefits of increasing Protonix include better symptom control. - Increase Protonix to twice daily  Obstructive Sleep Apnea Obstructive sleep apnea untreated due to inability to get CPAP. High sleepiness score  indicating need for further evaluation. Benefits of home sleep study include convenience and accurate diagnosis. - Schedule home sleep study  Follow-up - Follow up in 3-4 weeks - Complete tests ordered by Dr. Juliann Pares.      Advised if symptoms do not improve or worsen, to please contact office for sooner follow up or seek emergency care.    I spent 60 minutes of dedicated to the care of this patient on the date of this encounter to include pre-visit review of records, face-to-face time with the patient discussing conditions above, post visit ordering of testing, clinical documentation with the electronic health record, making appropriate referrals as documented, and communicating necessary findings to members of the patients care team.     C. Danice Goltz, MD Advanced Bronchoscopy PCCM [facility]    *This note was generated using voice recognition software/Dragon and/or AI transcription program.  Despite best efforts to proofread, errors can occur which can change the meaning. Any transcriptional errors that result from this process are unintentional and may not be fully corrected at the time of dictation.

## 2023-12-15 ENCOUNTER — Other Ambulatory Visit (HOSPITAL_COMMUNITY): Payer: Self-pay | Admitting: Emergency Medicine

## 2023-12-15 ENCOUNTER — Telehealth (HOSPITAL_COMMUNITY): Payer: Self-pay | Admitting: Emergency Medicine

## 2023-12-15 DIAGNOSIS — R079 Chest pain, unspecified: Secondary | ICD-10-CM

## 2023-12-15 MED ORDER — METOPROLOL TARTRATE 100 MG PO TABS: 100.0000 mg | ORAL_TABLET | Freq: Once | ORAL | 0 refills | Status: AC

## 2023-12-15 NOTE — Telephone Encounter (Signed)
Reaching out to patient to offer assistance regarding upcoming cardiac imaging study; pt verbalizes understanding of appt date/time, parking situation and where to check in, pre-test NPO status and medications ordered, and verified current allergies; name and call back number provided for further questions should they arise Rockwell Alexandria RN Navigator Cardiac Imaging [facility] [phone] office [phone] cell

## 2023-12-18 ENCOUNTER — Ambulatory Visit
Admission: RE | Admit: 2023-12-18 | Discharge: 2023-12-18 | Disposition: A | Discharge status: Home / Self Care | Payer: Medicare HMO | Source: Ambulatory Visit | Attending: Internal Medicine | Admitting: Internal Medicine

## 2023-12-18 DIAGNOSIS — R079 Chest pain, unspecified: Secondary | ICD-10-CM | POA: Diagnosis present

## 2023-12-18 DIAGNOSIS — I2510 Atherosclerotic heart disease of native coronary artery without angina pectoris: Secondary | ICD-10-CM | POA: Diagnosis not present

## 2023-12-18 MED ORDER — IOHEXOL 350 MG/ML SOLN
80.0000 mL | Freq: Once | INTRAVENOUS | Status: AC | PRN
  Administered 2023-12-18: 80 mL via INTRAVENOUS

## 2023-12-18 MED ORDER — NITROGLYCERIN 0.4 MG SL SUBL
0.8000 mg | SUBLINGUAL_TABLET | Freq: Once | SUBLINGUAL | Status: AC
Start: 2023-12-18 — End: 2023-12-18
  Administered 2023-12-18: 0.8 mg via SUBLINGUAL
  Filled 2023-12-18: qty 25

## 2023-12-18 MED ORDER — IOHEXOL 300 MG/ML  SOLN: 100.0000 mL | Freq: Once | INTRAMUSCULAR | Status: DC | PRN

## 2023-12-18 MED ORDER — DILTIAZEM HCL 25 MG/5ML IV SOLN
10.0000 mg | INTRAVENOUS | Status: DC | PRN
  Filled 2023-12-18: qty 5

## 2023-12-18 MED ORDER — METOPROLOL TARTRATE 5 MG/5ML IV SOLN
10.0000 mg | Freq: Once | INTRAVENOUS | Status: DC | PRN
  Filled 2023-12-18: qty 10

## 2023-12-18 NOTE — Progress Notes (Signed)
Patient tolerated procedure well. Ambulate w/o difficulty. Denies any lightheadedness or being dizzy. Pt denies any pain at this time. Sitting in chair. Pt is encouraged to drink additional water throughout the day and reason explained to patient. Patient verbalized understanding and all questions answered. ABC intact. No further needs at this time. Discharge from procedure area w/o issues.

## 2023-12-29 ENCOUNTER — Telehealth: Payer: Self-pay

## 2023-12-29 ENCOUNTER — Encounter: Payer: Self-pay | Admitting: Pulmonary Disease

## 2023-12-29 ENCOUNTER — Telehealth: Payer: Medicare HMO | Admitting: Pulmonary Disease

## 2023-12-29 DIAGNOSIS — J209 Acute bronchitis, unspecified: Secondary | ICD-10-CM

## 2023-12-29 DIAGNOSIS — J09X2 Influenza due to identified novel influenza A virus with other respiratory manifestations: Secondary | ICD-10-CM

## 2023-12-29 DIAGNOSIS — J101 Influenza due to other identified influenza virus with other respiratory manifestations: Secondary | ICD-10-CM

## 2023-12-29 DIAGNOSIS — J45909 Unspecified asthma, uncomplicated: Secondary | ICD-10-CM | POA: Diagnosis not present

## 2023-12-29 DIAGNOSIS — K2100 Gastro-esophageal reflux disease with esophagitis, without bleeding: Secondary | ICD-10-CM

## 2023-12-29 MED ORDER — AZITHROMYCIN 500 MG PO TABS: 500.0000 mg | ORAL_TABLET | Freq: Every day | ORAL | 0 refills | Status: AC

## 2023-12-29 MED ORDER — ALBUTEROL SULFATE (2.5 MG/3ML) 0.083% IN NEBU: 2.5000 mg | INHALATION_SOLUTION | Freq: Four times a day (QID) | RESPIRATORY_TRACT | 12 refills | Status: AC | PRN

## 2023-12-29 NOTE — Telephone Encounter (Signed)
 She can call [facility] at [phone]

## 2023-12-29 NOTE — Patient Instructions (Signed)
 VISIT SUMMARY:  Today, we discussed your ongoing shortness of breath and cough following a recent influenza infection. We also addressed your concerns about a potential bacterial bronchitis and the need for a sleep study to evaluate for sleep apnea. Additionally, we reviewed your general health maintenance and flu shot status.  YOUR PLAN:  -SHORTNESS OF BREATH: Your shortness of breath is likely worsened by your recent flu and possible bacterial bronchitis. We will continue your current medications, Breztri  twice daily and albuterol  three times daily, but you should not increase the Breztri  dosage as it can cause heart issues and more shortness of breath. We will also send a nebulizer for home use and prescribe the necessary medication for it.  -BACTERIAL BRONCHITIS: Your cough with brown sputum suggests a bacterial infection on top of your recent flu. We will prescribe an antibiotic that avoids your known allergies to Motrin, Cipro, and codeine.  -SUSPECTED SLEEP APNEA: Sleep apnea is a condition where breathing repeatedly stops and starts during sleep. We need to complete a sleep study to evaluate this. We will check the status of your sleep study and ensure it is scheduled for home use.  -GENERAL HEALTH MAINTENANCE: You asked about getting another flu shot, but since you have already had the flu this season, another shot is not needed at this time.  INSTRUCTIONS:  Please schedule a follow-up appointment in 4-6 weeks. Ensure that your sleep study is completed before the follow-up appointment.

## 2023-12-29 NOTE — Telephone Encounter (Signed)
 Patient saw Dr. Viva Grise today. She was suppose to have a HST done,but has not heard from [facility].  Almira Armour will you look into this and let the patient know. Thank you!

## 2023-12-29 NOTE — Progress Notes (Signed)
 Subjective:    Patient ID: Valerie Hopkins, female    DOB: May 12, 1961, 63 y.o.   MRN: [id number]  Patient Care Team: Rudy Alyce RAMAN, MD as PCP - General (Family Medicine) Tamea Dedra CROME, MD as Consulting Physician (Pulmonary Disease)  Chief Complaint  Patient presents with   Acute Visit    Diagnosed with the Flu on Monday.  SOB, Wheezing and cough with brown sputum. Headache Albuterol  - TID Breztri - 2 puffs BID    ~~~~~~~~~~~~~~~~~~~~~~~~~~~~~~~~~~~~~~~~~~~~~~~~~~~~~~~~~ Virtual Visit Via Video note:   This visit type was conducted due to national recommendations for restrictions regarding the COVID-19 pandemic.  This format is felt to be most appropriate for this patient at this time given the patient has acute influenza A infection.  All issues noted in this document were discussed and addressed.  No physical exam was performed (except for noted visual exam findings with Video Visits).    I connected with Valerie Hopkins by telephone at 8:45 AM and verified that I was speaking with the correct person using two identifiers. Location patient: home Location provider: [facility] Persons participating in the virtual visit: patient, physician   I discussed the limitations, risks, security and privacy concerns of performing an evaluation and management service by video and the availability of in person appointments. The patient expressed understanding and agreed to proceed. ~~~~~~~~~~~~~~~~~~~~~~~~~~~~~~~~~~~~~~~~~~~~~~~~~~~~~~~~~  BACKGROUND/INTERVAL:Patient is a 63 year old lifelong never smoker who presents on 14 December 2023 as an ACUTE VISIT due to issues with intermittent dyspnea and chest pain.  Of note the patient was seen in the emergency room at [facility] on 16 January with an extensive negative workup to include CT angio chest.  She was seen by Dr. Florencio, her cardiologist on 21 January with orders for echocardiogram and coronary CT. Dr. Florencio  suggested that she see pulmonary for potential sleep apnea.  Patient had a home sleep study ordered which has not been realized yet.  Patient was also noted to have thrush and was overusing Trelegy Ellipta .  Patient was provided Diflucan  and treated with Breztri  2 puffs twice a day and Trelegy discontinued.  Patient was diagnosed with influenza A 4 days ago.  HPI Discussed the use of AI scribe software for clinical note transcription with the patient, who gave verbal consent to proceed.  History of Present Illness   Valerie Hopkins is a 63 year old female who presents with shortness of breath and cough following influenza.  She experiences ongoing shortness of breath and cough with brown sputum production following a recent influenza infection. No chest pain, fever, or chills. She has not completed a sleep study for potential sleep apnea, which was supposed to be arranged as a home test.  She uses Breztri  twice daily as prescribed but wishes to increase the frequency.  She was advised that this is a maintenance inhaler and she should not increase the frequency of use due to potential cardiac side effects.  Additionally, she uses albuterol  three times a day. She does not have a nebulizer at home.  She has allergies to Motrin, Cipro, and codeine, which cause itching. She has taken prednisone twice this year without significant improvement in her symptoms.  She inquires about the possibility of receiving another flu shot, but it is noted that she has already had the flu this season.   During the video visit she appears acutely ill however does not appear to be in any respiratory distress.    Review of Systems A 10 point review of systems  was performed and it is as noted above otherwise negative.   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   Chronic migraine 04/14/2020   Left cervical radiculopathy 04/30/2019   Left-sided low back pain with sciatica 02/04/2019   Left elbow pain 01/16/2017   Cervical spondylosis without myelopathy 02/10/2016   Metatarsalgia of both feet 02/02/2015   Gastroesophageal reflux disease without esophagitis 12/18/2014   H/O Helicobacter infection 12/18/2014   History of gastric ulcer 12/18/2014   Essential hypertension 03/04/2014    Social History   Tobacco Use   Smoking status: Never   Smokeless tobacco: Never  Substance Use Topics   Alcohol use: Never    Allergies  Allergen Reactions   Naproxen Other (See Comments)   Ibuprofen Nausea Only    Other reaction(s): Other (See Comments) GI issues   Ciprofloxacin Swelling   Other Itching   Codeine Nausea And Vomiting    Other reaction(s): Vomiting   Lisinopril Cough   Oxycodone -Acetaminophen  Itching    Current Meds  Medication Sig   albuterol  (PROVENTIL ) (2.5 MG/3ML) 0.083% nebulizer solution Take 3 mLs (2.5 mg total) by nebulization every 6 (six) hours as needed for wheezing or shortness of breath.   albuterol  (VENTOLIN  HFA) 108 (90 Base) MCG/ACT inhaler Inhale 2 puffs into the lungs every 6 (six) hours as needed for wheezing or shortness of breath.   azithromycin  (ZITHROMAX ) 500 MG tablet Take 1 tablet (500 mg total) by mouth daily for 3 days.   Budeson-Glycopyrrol-Formoterol (BREZTRI  AEROSPHERE) 160-9-4.8 MCG/ACT AERO Inhale 2 puffs into the lungs in the morning and at bedtime.   isosorbide mononitrate (IMDUR) 30 MG 24 hr tablet Take 30 mg by mouth daily.   pantoprazole  (PROTONIX ) 40 MG tablet Take 1 tablet (40 mg total) by mouth 2 (two) times daily before a meal.   fluticasone  (FLONASE ) 50 MCG/ACT nasal spray Place 1 spray into both nostrils daily.    Immunization History  Administered Date(s) Administered   Dtap, Unspecified  06/28/1967, 07/26/1967, 10/11/1967, 02/20/1975   Fluad Quad(high Dose 65+) 07/22/2022   Influenza,inj,Quad PF,6+ Mos 08/19/2016, 09/26/2017, 08/18/2021   Influenza-Unspecified 08/08/2013, 08/08/2013, 08/11/2014, 08/11/2014, 08/10/2015, 08/10/2015, 06/25/2019, 06/25/2019, 08/15/2020, 07/23/2023   MMR 08/26/2004, 03/02/2011   Moderna Sars-Covid-2 Vaccination 01/17/2020, 02/14/2020, 09/22/2020, 04/29/2021   PPD Test 01/01/2018   Pfizer Covid-19 Vaccine Bivalent Booster 34yrs & up 01/18/2022   Polio, Unspecified 06/28/1967, 10/11/1967, 02/20/1975   Respiratory Syncytial Virus Vaccine,Recomb Aduvanted(Arexvy) 07/22/2022   Td (Adult),unspecified 06/25/2007, 02/15/2022   Tdap 11/21/2010, 01/01/2018, 02/15/2022   Zoster Recombinant(Shingrix) 04/14/2020, 11/23/2020        Objective:     There were no vitals taken for this visit.  Due to the nature of the visit, no physical exam was performed.  Patient did not exhibit conversational dyspnea during the visit.      Assessment & Plan:     ICD-10-CM   1. Influenza A  J10.1     2. Acute bronchitis due to infection  J20.9     3. Persistent asthma without complication, unspecified asthma severity  J45.909     4. Gastroesophageal reflux disease with esophagitis without hemorrhage  K21.00      Orders Placed This Encounter  Procedures   AMB REFERRAL FOR DME    Referral Priority:  Routine    Referral Type:   Durable Medical Equipment Purchase    Number of Visits Requested:   1   Meds ordered this encounter  Medications   azithromycin  (ZITHROMAX ) 500 MG tablet    Sig: Take 1 tablet (500 mg total) by mouth daily for 3 days.    Dispense:  3 tablet    Refill:  0   albuterol  (PROVENTIL ) (2.5 MG/3ML) 0.083% nebulizer solution    Sig: Take 3 mLs (2.5 mg total) by nebulization every 6 (six) hours as needed for wheezing or shortness of breath.    Dispense:  84 mL    Refill:  12   Discussion:    Shortness of Breath Likely exacerbated by  recent influenza and possible bacterial bronchitis. Currently using Breztri  twice daily and albuterol  three times daily. Informed that using Breztri  more than twice daily can cause cardiac issues and increased dyspnea. - Send nebulizer for home use - Prescribe nebulizer medication - Continue Breztri  twice daily - Prescribe antibiotic (azithromycin  500 mg daily x 3)  Bacterial Bronchitis Coughing up brown sputum, indicative of bacterial infection superimposed on recent influenza. Allergic to Motrin, Cipro, and codeine. Informed that prescribed antibiotic will avoid these allergens. - Prescribe antibiotic, azithromycin  as above as above  Suspected Sleep Apnea Sleep study not yet completed. Necessary to evaluate for potential sleep apnea. Will check status and ensure it is scheduled for home use. - Check status of sleep study and ensure scheduling for home use  General Health Maintenance Inquired about flu shot. Informed that it is not beneficial at this time as she has already had the flu. - No additional flu shot at this time  Follow-up - Schedule follow-up appointment in 4-6 weeks - Ensure sleep study is completed before follow-up.      Advised if symptoms do not improve or worsen, to please contact office for sooner follow up or seek emergency care.    I spent 22 minutes of dedicated to the care of this patient on the date of this encounter to include pre-visit review of records, face-to-face time via video with the patient discussing conditions above, post visit ordering of testing, clinical documentation with the electronic health record, making appropriate referrals as documented, and communicating necessary findings to members of the patients care team.     C. Leita Sanders, MD Advanced Bronchoscopy P[facility]    *This note was generated using voice recognition software/Dragon and/or AI transcription program.  Despite best efforts to proofread, errors  can occur which can change the meaning. Any transcriptional errors that result from this process are unintentional and may not be fully corrected at the time of dictation.

## 2024-01-11 ENCOUNTER — Ambulatory Visit: Payer: Medicare HMO | Admitting: Pulmonary Disease

## 2024-02-06 ENCOUNTER — Telehealth: Payer: Self-pay | Admitting: Pulmonary Disease

## 2024-02-06 NOTE — Telephone Encounter (Signed)
 Pt states still hasn't gotten her voice back since Jan 2025. Is also asking for more med assistance per her mychart message

## 2024-02-08 ENCOUNTER — Encounter: Admitting: Pulmonary Disease

## 2024-02-08 DIAGNOSIS — G4733 Obstructive sleep apnea (adult) (pediatric): Secondary | ICD-10-CM | POA: Diagnosis not present

## 2024-02-08 DIAGNOSIS — Z9189 Other specified personal risk factors, not elsewhere classified: Secondary | ICD-10-CM

## 2024-02-09 NOTE — Telephone Encounter (Signed)
 The SNAP HST was done on 01/24/24 and read by Dr. Vassie Loll on 02/08/24

## 2024-02-12 ENCOUNTER — Telehealth: Payer: Self-pay

## 2024-02-12 DIAGNOSIS — G4733 Obstructive sleep apnea (adult) (pediatric): Secondary | ICD-10-CM

## 2024-02-12 NOTE — Telephone Encounter (Signed)
-----   Message from Sarina Ser sent at 02/12/2024  8:48 AM EDT ----- Home sleep study corroborates sleep apnea.  She has by definition mild to moderate sleep apnea however, her symptom burden is very high.  Therefore recommend CPAP AutoSet at 5 to 15 cm H2O, heated humidity and mask of choice.

## 2024-02-12 NOTE — Telephone Encounter (Signed)
HST report has been scanned into Epic

## 2024-02-12 NOTE — Telephone Encounter (Signed)
 See telephone encounter from today (3/24). The patient has been notified and an order for a CPAP has been placed.  Nothing further needed.

## 2024-02-12 NOTE — Telephone Encounter (Signed)
 Patient advised. DME order placed. NFN.

## 2024-02-16 ENCOUNTER — Ambulatory Visit: Payer: Self-pay

## 2024-02-16 NOTE — Telephone Encounter (Signed)
 I notified the patient and she is aware of her appt on 4/2 at 9:45am.  Nothing further needed.

## 2024-02-16 NOTE — Telephone Encounter (Signed)
 This Triage RN Attempted to call the patient at this time. No answer and unable to leave voicemaili. "Call could not be completed as dialed."

## 2024-02-16 NOTE — Telephone Encounter (Signed)
  This Triage RN Attempted to call the patient at this time. No answer and unable to leave voicemaili. "Call could not be completed as dialed."

## 2024-02-16 NOTE — Telephone Encounter (Signed)
 Copied from CRM [id number]. Topic: Clinical - Medical Advice >> Feb 16, 2024 10:17 AM Valerie Hopkins wrote: Reason for CRM: patient is calling in because she has the flu and her voice is still not better, she wants to know if she should see her regular pulmonologist or her pcp. Please contact patient at [phone]  This Triage RN Attempted to call the patient at this time. No answer and unable to leave voicemaili. "Call could not be completed as dialed."

## 2024-02-16 NOTE — Telephone Encounter (Signed)
 Tested positive for the flu in Feb.  No fevers or chills. Does have sweats.  No increased SOB.  Wheezing.  Hoarseness which started after having the flu.  She does have an appt with on 02/23/2024 (Next Friday)  Ventolin- two times a day Breztri-  1 puff bid

## 2024-02-16 NOTE — Telephone Encounter (Signed)
 She canceled last appointment with me.  I see that she has a follow-up on 2 April.  Given her persistent symptoms she should seek evaluation at urgent care or ED.

## 2024-02-21 ENCOUNTER — Encounter: Payer: Self-pay | Admitting: Pulmonary Disease

## 2024-02-21 ENCOUNTER — Ambulatory Visit (INDEPENDENT_AMBULATORY_CARE_PROVIDER_SITE_OTHER): Admitting: Pulmonary Disease

## 2024-02-21 VITALS — BP 138/90 | HR 66 | Temp 97.8°F | Ht 62.0 in | Wt 208.0 lb

## 2024-02-21 DIAGNOSIS — J4540 Moderate persistent asthma, uncomplicated: Secondary | ICD-10-CM | POA: Diagnosis not present

## 2024-02-21 DIAGNOSIS — B370 Candidal stomatitis: Secondary | ICD-10-CM

## 2024-02-21 DIAGNOSIS — G4733 Obstructive sleep apnea (adult) (pediatric): Secondary | ICD-10-CM

## 2024-02-21 DIAGNOSIS — K219 Gastro-esophageal reflux disease without esophagitis: Secondary | ICD-10-CM

## 2024-02-21 LAB — NITRIC OXIDE: Nitric Oxide: 17

## 2024-02-21 MED ORDER — FLUCONAZOLE 100 MG PO TABS: 100.0000 mg | ORAL_TABLET | Freq: Every day | ORAL | 0 refills | Status: AC

## 2024-02-21 NOTE — Patient Instructions (Signed)
 VISIT SUMMARY:  Today, we discussed your ongoing issues with asthma, obstructive sleep apnea, and gastroesophageal reflux disease (GERD) with a hiatal hernia. We reviewed your current symptoms and made adjustments to your treatment plan to help manage these conditions more effectively.  YOUR PLAN:  -ASTHMA: Asthma is a condition where your airways narrow and swell, making it difficult to breathe. Continue using Breztri twice daily in the morning and evening. To help with the hoarseness and oral thrush, rinse your mouth with a baking soda solution after using your Breztri inhaler. You will also be prescribed a tablet to treat the oral thrush. You can use your rescue inhaler up to four times a day if needed.  -OBSTRUCTIVE SLEEP APNEA: Obstructive sleep apnea is a condition where your breathing repeatedly stops and starts during sleep. We recommend starting CPAP therapy with a nasal mask to help alleviate your symptoms. Please schedule a follow-up appointment in 6-8 weeks to assess how you are responding to the CPAP therapy.  -GASTROESOPHAGEAL REFLUX DISEASE (GERD) AND HIATAL HERNIA: GERD is a condition where stomach acid frequently flows back into the tube connecting your mouth and stomach, causing irritation. A hiatal hernia occurs when the upper part of your stomach bulges through the diaphragm. To manage your symptoms, increase your pantoprazole to twice daily. We will also refer you to a gastroenterologist for further evaluation and management.  INSTRUCTIONS:  Please schedule a follow-up appointment in 6-8 weeks to assess your response to the CPAP therapy. Additionally, follow up with a gastroenterologist for further evaluation and management of your GERD and hiatal hernia.

## 2024-02-21 NOTE — Progress Notes (Signed)
 Subjective:    Patient ID: Valerie Hopkins, female    DOB: [birth year], 62 y.o.   MRN: [id number]  Patient Care Team: Kennis Carina, MD as PCP - General (Family Medicine) Salena Saner, MD as Consulting Physician (Pulmonary Disease)  Chief Complaint  Patient presents with   Follow-up    Cough, shortness of breath and wheezing.     BACKGROUND/INTERVAL: Patient is a 63 year old lifelong never smoker who follows for the issue of shortness of breath cough and wheezing in the setting of moderate persistent asthma.  She has also gastroesophageal reflux and recently diagnosed mild sleep apnea with significant symptom burden.  Patient's last interaction with the clinic was on 29 December 2023 via video visit due to episode of influenza A.  HPI Discussed the use of AI scribe software for clinical note transcription with the patient, who gave verbal consent to proceed.  History of Present Illness   Valerie Hopkins is a 63 year old female with asthma and obstructive sleep apnea who presents for follow-up.  She experiences persistent hoarseness, which she attributes to a recent flu, and finds it difficult to rest her voice. She uses Breztri once daily even though she was instructed to use twice daily, and a rescue inhaler up to four times a day as needed.  A sleep study revealed mild sleep apnea (AHI 13.6) with decreased oxygen levels (nadir 76%). She continues to experience nocturnal awakenings and choking sensations.  Her symptom burden is quite significant.  She reports frequent sweats and chills but no recent fevers. Chest pain is infrequent.  She has issues with reflux, feeling like she is going to vomit, and is taking pantoprazole once daily, which she feels is ineffective.  Previously she was instructed to increase the pantoprazole to twice a day but she did not follow through with this.  She has a history of a small hiatal hernia, which may exacerbate her reflux symptoms, especially when  bending over. Certain foods are poorly tolerated.  She does not endorse any other symptomatology today.    Review of Systems A 10 point review of systems was performed and it is as noted above otherwise negative.   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   Chronic migraine 04/14/2020   Left cervical radiculopathy 04/30/2019   Left-sided low back pain with sciatica 02/04/2019   Left elbow pain 01/16/2017   Cervical spondylosis without myelopathy 02/10/2016   Metatarsalgia of both feet 02/02/2015   Gastroesophageal reflux disease without esophagitis 12/18/2014   H/O Helicobacter infection 12/18/2014   History of gastric ulcer 12/18/2014   Essential hypertension 03/04/2014    Social History   Tobacco Use   Smoking status: Never   Smokeless tobacco: Never  Substance Use Topics   Alcohol use: Never    Allergies  Allergen Reactions   Naproxen Other (See Comments)   Ibuprofen Nausea Only    Other reaction(s): Other (See Comments) GI issues   Ciprofloxacin Swelling   Other Itching   Codeine Nausea And Vomiting    Other reaction(s): Vomiting   Lisinopril Cough   Oxycodone-Acetaminophen Itching    Current Meds  Medication Sig   acetaminophen (TYLENOL) 500 MG tablet Take by mouth. Take 500 mg by mouth every 6 (six) hours as needed  for Pain   albuterol (PROVENTIL) (2.5 MG/3ML) 0.083% nebulizer solution Take 3 mLs (2.5 mg total) by nebulization every 6 (six) hours as needed for wheezing or shortness of breath.   albuterol (VENTOLIN HFA) 108 (90 Base) MCG/ACT inhaler Inhale 2 puffs into the lungs every 6 (six) hours as needed for wheezing or shortness of breath.   atorvastatin (LIPITOR) 10 MG tablet Take 10 mg by mouth daily.    Budeson-Glycopyrrol-Formoterol (BREZTRI AEROSPHERE) 160-9-4.8 MCG/ACT AERO Inhale 2 puffs into the lungs in the morning and at bedtime.   butalbital-acetaminophen-caffeine (FIORICET, ESGIC) 50-325-40 MG tablet Take by mouth as needed for migraine.   cholecalciferol (VITAMIN D3) 25 MCG (1000 UNIT) tablet Take 1,000 Units by mouth daily.   fluconazole (DIFLUCAN) 100 MG tablet Take 1 tablet (100 mg total) by mouth daily for 14 days.   Folic Acid-Vit B6-Vit B12 (FOLBEE) 2.5-25-1 MG TABS tablet Take 1 tablet by mouth daily.   gabapentin (NEURONTIN) 600 MG tablet Take 600 mg by mouth daily.   losartan-hydrochlorothiazide (HYZAAR) 100-25 MG tablet Take 1 tablet by mouth every morning.   methocarbamol (ROBAXIN) 750 MG tablet Take 750 mg by mouth 2 (two) times daily as needed.   Multiple Vitamin (MULTIVITAMIN ADULT PO) Take by mouth.   pantoprazole (PROTONIX) 40 MG tablet Take 1 tablet (40 mg total) by mouth 2 (two) times daily before a meal.   topiramate (TOPAMAX) 50 MG tablet Take by mouth. Take 3 tablets (150 mg total) by mouth at bedtime Stop rx for 200mg     Immunization History  Administered Date(s) Administered   Dtap, Unspecified 06/28/1967, 07/26/1967, 10/11/1967, 02/20/1975   Fluad Quad(high Dose 65+) 07/22/2022   Influenza,inj,Quad PF,6+ Mos 08/19/2016, 09/26/2017, 08/18/2021   Influenza-Unspecified 08/08/2013, 08/08/2013, 08/11/2014, 08/11/2014, 08/10/2015, 08/10/2015, 06/25/2019, 06/25/2019, 08/15/2020, 07/23/2023   MMR 08/26/2004, 03/02/2011   Moderna Sars-Covid-2 Vaccination 01/17/2020, 02/14/2020, 09/22/2020, 04/29/2021   PPD Test 01/01/2018   Pfizer Covid-19 Vaccine Bivalent Booster 50yrs & up 01/18/2022   Polio, Unspecified 06/28/1967, 10/11/1967, 02/20/1975   Respiratory Syncytial Virus Vaccine,Recomb Aduvanted(Arexvy) 07/22/2022   Td (Adult),unspecified 06/25/2007, 02/15/2022   Tdap 11/21/2010, 01/01/2018, 02/15/2022   Zoster Recombinant(Shingrix) 04/14/2020, 11/23/2020         Objective:     BP (!) 138/90 (BP Location: Left Arm, Patient Position: Sitting, Cuff Size: Normal)   Pulse 66   Temp 97.8 F (36.6 C) (Temporal)   Ht 5\' 2"  (1.575 m)   Wt 208 lb (94.3 kg)   SpO2 98%   BMI 38.04 kg/m   SpO2: 98 %  GENERAL: Overweight woman, ambulatory with assistance of a cane. No conversational dyspnea.  No acute distress. HEAD: Normocephalic, atraumatic.  EYES: Pupils equal, round, reactive to light.  No scleral icterus.  MOUTH: Oral mucosa moist.  + Mild oropharyngeal thrush. NECK: Supple. No thyromegaly. Trachea midline. No JVD.  No adenopathy. PULMONARY: Good air entry bilaterally.  No adventitious sounds. CARDIOVASCULAR: S1 and S2. Regular rate and rhythm.  No rubs, murmurs or gallops heard.   ABDOMEN: Benign. MUSCULOSKELETAL: No joint deformity, no clubbing, 2+ edema left lower extremity compared to right, no redness or warmth.  No palpable cords. NEUROLOGIC: Grossly nonfocal.  Gait not tested. SKIN: Intact,warm,dry.  Venous stasis changes on the left lower extremity PSYCH: Mood and behavior normal.  Lab Results  Component Value Date   NITRICOXIDE 17 02/21/2024        Assessment & Plan:     ICD-10-CM   1. Moderate persistent asthma without complication  J45.40 Nitric oxide  2. Gastroesophageal reflux disease, unspecified whether esophagitis present  K21.9 Ambulatory referral to Gastroenterology    3. OSA (obstructive sleep apnea)  G47.33 AMB REFERRAL FOR DME    4. Oropharyngeal candidiasis  B37.0       Orders Placed This Encounter  Procedures   Ambulatory referral to Gastroenterology    Referral Priority:   Routine    Referral Type:   Consultation    Referral Reason:   Specialty Services Required    Referred to Provider:   Toney Reil, MD    Requested Specialty:   Gastroenterology    Number of Visits Requested:   1   AMB REFERRAL FOR DME    Referral Priority:   Routine    Referral Type:   Durable Medical Equipment Purchase     Number of Visits Requested:   1   Nitric oxide    Meds ordered this encounter  Medications   fluconazole (DIFLUCAN) 100 MG tablet    Sig: Take 1 tablet (100 mg total) by mouth daily for 14 days.    Dispense:  14 tablet    Refill:  0   Discussion:    Asthma/asthmatic bronchitis Chronic asthma/asthmatic bronchitis managed with Breztri. Experiencing hoarseness likely due to vocal cord irritation from recent flu and inhaler use. Presence of oral thrush noted, contributing to hoarseness. - Use Breztri twice daily, morning and evening - Rinse mouth with baking soda solution after inhaler use - Prescribe fluconazole for oral thrush - Allow use of rescue inhaler up to four times a day if needed  Obstructive Sleep Apnea Mild obstructive sleep apnea with symptoms more severe than indicated by sleep study. Oxygen levels decrease during sleep, and experiences nocturnal awakenings and choking sensations. CPAP therapy recommended to alleviate symptoms. - Initiate AutoSet CPAP 5 to 12 cm H2O therapy with nasal mask (AirTouch, ResMed), heated humidity. - Schedule follow-up in 6-8 weeks to assess response to CPAP  Gastroesophageal Reflux Disease (GERD) and Hiatal Hernia Persistent reflux symptoms despite pantoprazole therapy. Symptoms include sensation of nausea and potential hiatal hernia contributing to reflux. Reports certain foods exacerbate symptoms. Small hiatal hernia identified, likely contributing to reflux symptoms and abdominal cramping when bending over. Further evaluation by gastroenterologist needed. - Increase pantoprazole to twice daily - Refer to gastroenterologist for further evaluation and management     Advised if symptoms do not improve or worsen, to please contact office for sooner follow up or seek emergency care.    I spent 40 minutes of dedicated to the care of this patient on the date of this encounter to include pre-visit review of records, face-to-face time with the  patient discussing conditions above, post visit ordering of testing, clinical documentation with the electronic health record, making appropriate referrals as documented, and communicating necessary findings to members of the patients care team.     C. Danice Goltz, MD Advanced Bronchoscopy PCCM [facility]    *This note was generated using voice recognition software/Dragon and/or AI transcription program.  Despite best efforts to proofread, errors can occur which can change the meaning. Any transcriptional errors that result from this process are unintentional and may not be fully corrected at the time of dictation.

## 2024-02-27 ENCOUNTER — Telehealth: Payer: Self-pay | Admitting: Pulmonary Disease

## 2024-02-27 ENCOUNTER — Telehealth: Payer: Self-pay

## 2024-02-27 NOTE — Telephone Encounter (Signed)
 Synetta Fail called to inquire if we received the patient referral. I informed her that the patient is established with [facility] and they will need to follow up with them. Synetta Fail stated that she will inform the doctor.

## 2024-02-27 NOTE — Telephone Encounter (Signed)
 Dr. Jayme Cloud you saw this patient on 02/21/24 and placed GI order sent to Dr. Allegra Lai. I spoke with Deshana with [facility] and she stated that since the patient has been seen at [facility] for GI she would have to continue seeing [facility].

## 2024-02-29 NOTE — Telephone Encounter (Signed)
 Synetta Fail has sent the referral to [facility].  Nothing further needed.

## 2024-03-18 ENCOUNTER — Other Ambulatory Visit: Payer: Self-pay | Admitting: Internal Medicine

## 2024-03-18 DIAGNOSIS — I712 Thoracic aortic aneurysm, without rupture, unspecified: Secondary | ICD-10-CM

## 2024-03-19 ENCOUNTER — Encounter (HOSPITAL_COMMUNITY): Payer: Self-pay

## 2024-03-20 ENCOUNTER — Other Ambulatory Visit: Payer: Self-pay | Admitting: Internal Medicine

## 2024-03-20 ENCOUNTER — Ambulatory Visit
Admission: RE | Admit: 2024-03-20 | Discharge: 2024-03-20 | Disposition: A | Discharge status: Home / Self Care | Source: Ambulatory Visit | Attending: Internal Medicine | Admitting: Internal Medicine

## 2024-03-20 DIAGNOSIS — I712 Thoracic aortic aneurysm, without rupture, unspecified: Secondary | ICD-10-CM

## 2024-03-20 MED ORDER — GADOBUTROL 1 MMOL/ML IV SOLN
12.0000 mL | Freq: Once | INTRAVENOUS | Status: AC | PRN
  Administered 2024-03-20: 12 mL via INTRAVENOUS

## 2024-04-02 ENCOUNTER — Telehealth: Payer: Self-pay | Admitting: Pulmonary Disease

## 2024-04-02 NOTE — Telephone Encounter (Signed)
 Lets have her see Dr. Kieran Pellet for evaluation of this issue.  Her pressures are not set too high so need to delve into sleep issues a little bit more.

## 2024-04-02 NOTE — Telephone Encounter (Signed)
 Patient states CPAP machine blowing too much air. Would like pressure settings change. Currently not using CPAP machine. Patient phone number is [phone].

## 2024-04-11 ENCOUNTER — Ambulatory Visit: Attending: Unknown Physician Specialty | Admitting: Speech Pathology

## 2024-04-11 DIAGNOSIS — R490 Dysphonia: Secondary | ICD-10-CM | POA: Diagnosis present

## 2024-04-11 NOTE — Therapy (Signed)
 OUTPATIENT SPEECH LANGUAGE PATHOLOGY  VOICE EVALUATION   Patient Name: Valerie Hopkins MRN: [id number] DOB:1961-09-26, 63 y.o., female Today's Date: 04/12/2024  PCP: Jerrlyn Morel, MD REFERRING PROVIDER: Lesly Raspberry, MD   End of Session - 04/12/24 1428     Visit Number 1    Number of Visits 17    Date for SLP Re-Evaluation 06/06/24    Authorization Type [employer]    Progress Note Due on Visit 10    SLP Start Time 1025    SLP Stop Time  1115    SLP Time Calculation (min) 50 min    Activity Tolerance Patient tolerated treatment well             Past Medical History:  Diagnosis Date   Hypertension    Migraine    Past Surgical History:  Procedure Laterality Date   ABDOMINAL HYSTERECTOMY     CHOLECYSTECTOMY     FRACTURE SURGERY     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   Chronic migraine 04/14/2020   Left cervical radiculopathy 04/30/2019   Left-sided low back pain with sciatica 02/04/2019   Left elbow pain 01/16/2017   Cervical spondylosis without myelopathy 02/10/2016   Metatarsalgia of both feet 02/02/2015   Gastroesophageal reflux disease without esophagitis 12/18/2014   H/O Helicobacter infection 12/18/2014   History of gastric ulcer 12/18/2014   Essential hypertension 03/04/2014    ONSET DATE: 04/02/2024 date of referral  REFERRING DIAG: R49.0 (ICD-10-CM) - Dysphonia   THERAPY DIAG:  Dysphonia  Rationale for Evaluation and Treatment Rehabilitation  SUBJECTIVE:   SUBJECTIVE STATEMENT: Pt pleasant, eager Pt accompanied by: self  PERTINENT HISTORY: Pt is a 62 year old female who was referred by her ENT for chief compliant of hoarseness reported as "loss of voice and raspy and moderate-severe in  severity. She has associated dry mouth, throat clearing, and throat pain (mild). The hoarseness has been present for 3 months. The hoarseness occurs all the time. The hoarseness developed gradually (months) and has been worsening."   Laryngoscopy on 04/02/2024 revealed mild bowing and mild edema  A chart review reveals documentation of voice hoarseness and frequent throat clearing in the following notes dated:  02/27/2023 06/21/2023 12/14/2023  "Patient has persistent dry cough and throat clearing, likely due to post nasal drip +/- GERD.   Voice hoarseness - Patient has dry cough along with voice hoarseness. Mostly likely cause is from PND symptoms and cough. Reflux could also be contributor. If not better with allergy medication consider trial off DPI and/or referral to ENT.  Gastroesophageal reflux disease without esophagitis - Hx H Pylori and gastric ulcer. No overt reflux symptoms except for cough/voice hoarseness. Patient is compliant with Prilosec 40mg  daily and GERD diet"   02/21/2024 d/t continued use of Breztri , oral thrush present - pt noncompliant with rinsing after treatment "She experiences persistent hoarseness, which she attributes to a recent flu, and finds it difficult to rest her voice. "  03/15/2024 and 03/29/2024   PAIN:  Are you having pain? No   FALLS: Has patient fallen in last 6 months? 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 Misuse: Speaks excessively, Hyperfunction, Excessively low pitch,  Speaks without adequate warm-up, Speaks without adequate breath support, Speaks on residual capacity, and Other:habitual throat clearing Phonotraumatic behaviors: Speaks in noise, Performs or speaks to large groups without amplification, Speaks at a great distance from listeners, Excessive voice use, Excessive voice use during colds/illnesses, and Excessive and/or habitual throat clearing  PERCEPTUAL VOICE ASSESSMENT: Voice quality: hoarse, breathy, harsh, rough, low vocal intensity, and vocal fatigue Vocal abuse: habitual throat clearing and excessive voice use Resonance: normal Respiratory function: clavicular breathing  OBJECTIVE VOICE ASSESSMENT: Sustained "ah" maximum phonation time: < 2 seconds seconds Sustained "ah" loudness average: glottal fry unable to obtain dB Average fundamental frequency during sustained "ah":128 Hz   (4 SD below average of  244 Hz +/- 27 for gender)  Oral reading (passage) loudness average: 72 dB Oral reading loudness range: 19 dB Conversational pitch average: 135 Hz Highest dynamic pitch in conversational speech: 180 Hz Lowest dynamic pitch in conversational speech: 106 Hz Conversational pitch range: 73 Hz Conversational loudness average: 72 dB Conversational loudness range: 13 dB S/z ratio: 1.45 (Suggestive of dysfunction >1.0) Voice quality: hoarse, breathy, harsh, rough, low vocal intensity, and vocal fatigue    ORAL MOTOR EXAMINATION Facial : WFL Lingual: WFL Velum: WFL Mandible: WFL Cough: WFL Voice: Hoarse, Breathy, Weak   PATIENT REPORTED OUTCOME MEASURES (PROM): To be completed within next 3 sessions   TODAY'S TREATMENT:  Skilled verbal and written information provided on phono-traumatic behavior of whispering and throat clearing. pictures shown of vocal folds and description of function. strategies to employ instead of thraot clearing including, hard swallow, sip of water, us  eof pectin base throat lozenge.     PATIENT  EDUCATION: Education details: results of this assessment, ST POC Person educated: Patient Education method: Explanation Education comprehension: needs further education   HOME EXERCISE PROGRAM:  Practice aggressive vocal rest  Throat clearing suppression strategies     GOALS: Goals reviewed with patient? Yes  SHORT TERM GOALS: Target date: 10 sessions  The patient will eliminate phonotraumatic behaviors such as chronic throat clearing, by substituting non-traumatic methods to clear mucus.  Baseline: Goal status: INITIAL  2.  Pt will discriminate healthy use from misuse of the voice in various situations with 80% accuracy across 3 data collections.  Baseline:  Goal status: INITIAL  3.  Pt will use abdominal breathing when producing sentences to support phonation in 80% of trials across 3 data sessions.  Baseline:  Goal status: INITIAL   LONG TERM GOALS: Target date: 06/06/2024  The patient will demonstrate independent understanding of vocal hygiene concepts.  Baseline:  Goal status: INITIAL  2.  The patient will be independent for abdominal breathing and breath support exercises.  Baseline:  Goal status: INITIAL  3.  Patient will report improved communication effectiveness as measured by PROM Baseline:  Goal status: INITIAL   ASSESSMENT:  CLINICAL IMPRESSION: Patient is a 63 y.o. 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 In addition, pt with habitual throat clear and intermittent voluntary whispering.   OBJECTIVE IMPAIRMENTS include voice disorder. These impairments  are limiting patient from effectively communicating at home and in community and returning to singing. Factors affecting potential to achieve goals and functional outcome are previous level of function, severity of impairments, and time post  onset/chronic nature of dysphonia and throat clearing/ intermittent report of reduced compliance with recommendations. Patient will benefit from skilled SLP services to address above impairments and improve overall function.  REHAB POTENTIAL: Good  PLAN: SLP FREQUENCY: 1-2x/week  SLP DURATION: 8 weeks  PLANNED INTERVENTIONS: Functional tasks, SLP instruction and feedback, and Compensatory strategies    Hobie Kohles B. Garlin Junker, M.S., CCC-SLP, Tree surgeon Certified Brain Injury Specialist [facility]  [facility] Office [phone] Ascom [phone] Fax [phone]

## 2024-04-17 ENCOUNTER — Telehealth: Payer: Self-pay

## 2024-04-17 ENCOUNTER — Ambulatory Visit: Admitting: Speech Pathology

## 2024-04-17 DIAGNOSIS — R490 Dysphonia: Secondary | ICD-10-CM | POA: Diagnosis not present

## 2024-04-17 NOTE — Telephone Encounter (Signed)
 Patient came in the office today. Her CPAP settings (EPR) were changed on 5/19. She said she is still having problems with the pressure. She said is too much and taking her breath when she tried to use the machine.

## 2024-04-17 NOTE — Therapy (Signed)
 OUTPATIENT SPEECH LANGUAGE PATHOLOGY  VOICE TREATMENT NOTE   Patient Name: Valerie Hopkins MRN: [id number] DOB:01-01-1961, 63 y.o., female Today's Date: 04/17/2024  PCP: Jerrlyn Morel, MD REFERRING PROVIDER: Lesly Raspberry, MD   End of Session - 04/17/24 1105     Visit Number 2    Number of Visits 17    Date for SLP Re-Evaluation 06/06/24    Authorization Type [employer] Medicare    Progress Note Due on Visit 10    SLP Start Time 1100    SLP Stop Time  1145    SLP Time Calculation (min) 45 min    Activity Tolerance Patient tolerated treatment well             Past Medical History:  Diagnosis Date   Hypertension    Migraine    Past Surgical History:  Procedure Laterality Date   ABDOMINAL HYSTERECTOMY     CHOLECYSTECTOMY     FRACTURE SURGERY     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   Chronic migraine 04/14/2020   Left cervical radiculopathy 04/30/2019   Left-sided low back pain with sciatica 02/04/2019   Left elbow pain 01/16/2017   Cervical spondylosis without myelopathy 02/10/2016   Metatarsalgia of both feet 02/02/2015   Gastroesophageal reflux disease without esophagitis 12/18/2014   H/O Helicobacter infection 12/18/2014   History of gastric ulcer 12/18/2014   Essential hypertension 03/04/2014    ONSET DATE: 04/02/2024 date of referral  REFERRING DIAG: R49.0 (ICD-10-CM) - Dysphonia   THERAPY DIAG:  Dysphonia  Rationale for Evaluation and Treatment Rehabilitation  SUBJECTIVE:   PERTINENT HISTORY: Pt is a 63 year old female who was referred by her ENT for chief compliant of hoarseness reported as "loss of voice and raspy and moderate-severe in severity. She has associated dry mouth, throat clearing, and throat  pain (mild). The hoarseness has been present for 3 months. The hoarseness occurs all the time. The hoarseness developed gradually (months) and has been worsening."   Laryngoscopy on 04/02/2024 revealed mild bowing and mild edema  A chart review reveals documentation of voice hoarseness and frequent throat clearing in the following notes dated:  02/27/2023 06/21/2023 12/14/2023  "Patient has persistent dry cough and throat clearing, likely due to post nasal drip +/- GERD.   Voice hoarseness - Patient has dry cough along with voice hoarseness. Mostly likely cause is from PND symptoms and cough. Reflux could also be contributor. If not better with allergy medication consider trial off DPI and/or referral to ENT.  Gastroesophageal reflux disease without esophagitis - Hx H Pylori and gastric ulcer. No overt reflux symptoms except for cough/voice hoarseness. Patient is compliant with Prilosec 40mg  daily and GERD diet"   02/21/2024 d/t continued use of Breztri , oral thrush present - pt noncompliant with rinsing after treatment "She experiences persistent hoarseness, which she attributes to a recent flu, and finds it difficult to rest her voice. "  03/15/2024 and 03/29/2024   PAIN:  Are you having pain? No   FALLS: Has patient fallen in last 6 months? No,   LIVING ENVIRONMENT: Lives with: lives with an adult companion Lives in: House/apartment  PLOF: Independent  PATIENT GOALS    to assess and improve vocal quality  SUBJECTIVE STATEMENT: Pt pleasant, appeared sleepy, subjectively more hoarse  today Pt accompanied by: self  OBJECTIVE:   TODAY'S TREATMENT:  Skilled treatment session targeted pt's dysphonia goals. SLP facilitated session by providing the following interventions:  Reviewed chart with pt regarding previously documented hoarseness and throat clearing. She reports ability to continue singing with hoarseness worsening and inability to sing since flu in February 2025. She  also continues to report symptoms of thrush with intermittent pain. States that "the other doctor (Dr Silvestre Drum) has called in something to help."  Pt arrives with habitual throat clearing, benefits from reminders to use swallow or sip of water to help suppress. She reports "I have done good all week working on it." Skilled verbal and printed information provided on additional strategies as well as handout on mucus and phono-traumatic habits.   Pt presents with overall subjectively worsened dysphonia c/b increased hoarseness almost aphonia. Pt states that she has not been successful with vocal rest. This Clinical research associate suggested that engaging significant other in reminders to cease talking as well as reminders for throat clearing might be helpful. Extensive education provided on poorer prognosis associated with reduced vocal rest.    PATIENT REPORTED OUTCOME MEASURES (PROM):  VOICE HANDICAP INDEX (VHI)  The Voice Handicap Index is comprised of a series of questions to assess the patient's perception of their voice. It is designed to evaluate the emotional, physical and functional components of the voice problem.  Functional: 30 Physical: 31 Emotional: 33 Total: 94 (Normal mean 8.75, SD =14.97)  z score =  5.7: severe = 3.00+    PATIENT EDUCATION: Education details: vocal rest, see the above Person educated: Patient Education method: Explanation Education comprehension: needs further education   HOME EXERCISE PROGRAM:  Practice aggressive vocal rest  Throat clearing suppression strategies     GOALS: Goals reviewed with patient? Yes  SHORT TERM GOALS: Target date: 10 sessions  The patient will eliminate phonotraumatic behaviors such as chronic throat clearing, by substituting non-traumatic methods to clear mucus.  Baseline: Goal status: INITIAL  2.  Pt will discriminate healthy use from misuse of the voice in various situations with 80% accuracy across 3 data collections.  Baseline:   Goal status: INITIAL  3.  Pt will use abdominal breathing when producing sentences to support phonation in 80% of trials across 3 data sessions.  Baseline:  Goal status: INITIAL   LONG TERM GOALS: Target date: 06/06/2024  The patient will demonstrate independent understanding of vocal hygiene concepts.  Baseline:  Goal status: INITIAL  2.  The patient will be independent for abdominal breathing and breath support exercises.  Baseline:  Goal status: INITIAL  3.  Patient will report improved communication effectiveness as measured by PROM Baseline:  Goal status: INITIAL   ASSESSMENT:  CLINICAL IMPRESSION: Patient is a 63 y.o. 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 In addition, pt with habitual throat clear and intermittent voluntary whispering.   See the above treatment note for details.   OBJECTIVE IMPAIRMENTS include voice disorder. These impairments are limiting patient from effectively communicating at home and in community and returning to singing. Factors affecting potential to achieve goals and functional outcome are previous level of function, severity of impairments, and time post onset/chronic nature of dysphonia and throat clearing/ intermittent report of reduced compliance with recommendations. Patient will benefit from skilled SLP services to address above impairments and improve overall  function.  REHAB POTENTIAL: Good  PLAN: SLP FREQUENCY: 1-2x/week  SLP DURATION: 8 weeks  PLANNED INTERVENTIONS: Functional tasks, SLP instruction and feedback, and Compensatory strategies    Jonisha Kindig B. Garlin Junker, M.S., CCC-SLP, Tree surgeon Certified Brain Injury Specialist [facility]  [facility] Rehabilitation Services Office [phone] Ascom [phone] Fax  [phone]

## 2024-04-17 NOTE — Telephone Encounter (Addendum)
 Settings have been changed.  LMTCB. E2C2 please advise when patient calls back.

## 2024-04-18 NOTE — Telephone Encounter (Signed)
 I have notified.  Nothing further needed.

## 2024-04-19 ENCOUNTER — Ambulatory Visit: Admitting: Speech Pathology

## 2024-04-19 DIAGNOSIS — R490 Dysphonia: Secondary | ICD-10-CM

## 2024-04-19 NOTE — Therapy (Signed)
 OUTPATIENT SPEECH LANGUAGE PATHOLOGY  VOICE TREATMENT NOTE   Patient Name: Valerie Hopkins MRN: [id number] DOB:1961/11/11, 63 y.o., female Today's Date: 04/19/2024  PCP: Jerrlyn Morel, MD REFERRING PROVIDER: Lesly Raspberry, MD   End of Session - 04/19/24 1106     Visit Number 3    Number of Visits 17    Date for SLP Re-Evaluation 06/06/24    Authorization Type [employer] Medicare    Progress Note Due on Visit 10    SLP Start Time 1100    SLP Stop Time  1145    SLP Time Calculation (min) 45 min    Activity Tolerance Patient tolerated treatment well             Past Medical History:  Diagnosis Date   Hypertension    Migraine    Past Surgical History:  Procedure Laterality Date   ABDOMINAL HYSTERECTOMY     CHOLECYSTECTOMY     FRACTURE SURGERY     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   Chronic migraine 04/14/2020   Left cervical radiculopathy 04/30/2019   Left-sided low back pain with sciatica 02/04/2019   Left elbow pain 01/16/2017   Cervical spondylosis without myelopathy 02/10/2016   Metatarsalgia of both feet 02/02/2015   Gastroesophageal reflux disease without esophagitis 12/18/2014   H/O Helicobacter infection 12/18/2014   History of gastric ulcer 12/18/2014   Essential hypertension 03/04/2014    ONSET DATE: 04/02/2024 date of referral  REFERRING DIAG: R49.0 (ICD-10-CM) - Dysphonia   THERAPY DIAG:  Dysphonia  Rationale for Evaluation and Treatment Rehabilitation  SUBJECTIVE:   PERTINENT HISTORY: Pt is a 63 year old female who was referred by her ENT for chief compliant of hoarseness reported as "loss of voice and raspy and moderate-severe in severity. She has associated dry mouth, throat clearing, and throat  pain (mild). The hoarseness has been present for 3 months. The hoarseness occurs all the time. The hoarseness developed gradually (months) and has been worsening."   Laryngoscopy on 04/02/2024 revealed mild bowing and mild edema  A chart review reveals documentation of voice hoarseness and frequent throat clearing in the following notes dated:  02/27/2023 06/21/2023 12/14/2023  "Patient has persistent dry cough and throat clearing, likely due to post nasal drip +/- GERD.   Voice hoarseness - Patient has dry cough along with voice hoarseness. Mostly likely cause is from PND symptoms and cough. Reflux could also be contributor. If not better with allergy medication consider trial off DPI and/or referral to ENT.  Gastroesophageal reflux disease without esophagitis - Hx H Pylori and gastric ulcer. No overt reflux symptoms except for cough/voice hoarseness. Patient is compliant with Prilosec 40mg  daily and GERD diet"   02/21/2024 d/t continued use of Breztri , oral thrush present - pt noncompliant with rinsing after treatment "She experiences persistent hoarseness, which she attributes to a recent flu, and finds it difficult to rest her voice. "  03/15/2024 and 03/29/2024   PAIN:  Are you having pain? No   FALLS: Has patient fallen in last 6 months? No,   LIVING ENVIRONMENT: Lives with: lives with an adult companion Lives in: House/apartment  PLOF: Independent  PATIENT GOALS    to assess and improve vocal quality  SUBJECTIVE STATEMENT: Pt pleasant,  Pt accompanied by: self  OBJECTIVE:   TODAY'S TREATMENT:  Skilled treatment session targeted pt's dysphonia goals. SLP facilitated session by providing the following interventions:  Pt with increased awareness and attempts to reduce throat clears by drinking liquid during this session. During more unstructured times such as walking down the hall, she was less aware. When drinking liquid and using a pectin based throat lozenge she  was able to suppress ~ 50% of throat clears.   She reports increased success with vocal rest and subjectively improved dysphonia during brief utterances in response to SLP questions.   Skilled verbal and written education provided on reflux precautions,diet recommendations (bland foods), frequent small meals, proactive treatment vs responsive treatment with flare.    PATIENT EDUCATION: Education details: vocal rest, see the above Person educated: Patient Education method: Explanation Education comprehension: needs further education   HOME EXERCISE PROGRAM:  Practice aggressive vocal rest  Throat clearing suppression strategies     GOALS: Goals reviewed with patient? Yes  SHORT TERM GOALS: Target date: 10 sessions  The patient will eliminate phonotraumatic behaviors such as chronic throat clearing, by substituting non-traumatic methods to clear mucus.  Baseline: Goal status: INITIAL  2.  Pt will discriminate healthy use from misuse of the voice in various situations with 80% accuracy across 3 data collections.  Baseline:  Goal status: INITIAL  3.  Pt will use abdominal breathing when producing sentences to support phonation in 80% of trials across 3 data sessions.  Baseline:  Goal status: INITIAL   LONG TERM GOALS: Target date: 06/06/2024  The patient will demonstrate independent understanding of vocal hygiene concepts.  Baseline:  Goal status: INITIAL  2.  The patient will be independent for abdominal breathing and breath support exercises.  Baseline:  Goal status: INITIAL  3.  Patient will report improved communication effectiveness as measured by PROM Baseline:  Goal status: INITIAL   ASSESSMENT:  CLINICAL IMPRESSION: Patient is a 63 y.o. 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 In addition, pt with  habitual throat clear and intermittent voluntary whispering.   Pt responsive to the above recommended strategies and information. She voiced understanding. See the above treatment note for details.   OBJECTIVE IMPAIRMENTS include voice disorder. These impairments are limiting patient from effectively communicating at home and in community and returning to singing. Factors affecting potential to achieve goals and functional outcome are previous level of function, severity of impairments, and time post onset/chronic nature of dysphonia and throat clearing/ intermittent report of reduced compliance with recommendations. Patient will benefit from skilled SLP services to address above impairments and improve overall function.  REHAB POTENTIAL: Good  PLAN: SLP FREQUENCY: 1-2x/week  SLP DURATION: 8 weeks  PLANNED INTERVENTIONS: Functional tasks, SLP instruction and feedback, and Compensatory strategies    Jessic Standifer B. Garlin Junker, M.S., CCC-SLP, Tree surgeon Certified Brain Injury Specialist [facility]  [facility] Rehabilitation Services Office [phone] Ascom [phone] Fax [phone]

## 2024-04-22 ENCOUNTER — Ambulatory Visit: Attending: Unknown Physician Specialty | Admitting: Speech Pathology

## 2024-04-22 DIAGNOSIS — R490 Dysphonia: Secondary | ICD-10-CM | POA: Diagnosis present

## 2024-04-22 NOTE — Therapy (Signed)
 OUTPATIENT SPEECH LANGUAGE PATHOLOGY  VOICE TREATMENT NOTE   Patient Name: Valerie Hopkins MRN: [id number] DOB:January 06, 1961, 63 y.o., female Today's Date: 04/22/2024  PCP: Jerrlyn Morel, MD REFERRING PROVIDER: Lesly Raspberry, MD   End of Session - 04/22/24 1019     Visit Number 4    Number of Visits 17    Date for SLP Re-Evaluation 06/06/24    Authorization Type [employer] Medicare    Authorization Time Period 04/11/2024 thru 06/06/2024    Authorization - Visit Number 4    Authorization - Number of Visits 17    Progress Note Due on Visit 10    SLP Start Time 1015    SLP Stop Time  1100    SLP Time Calculation (min) 45 min    Activity Tolerance Patient tolerated treatment well             Past Medical History:  Diagnosis Date   Hypertension    Migraine    Past Surgical History:  Procedure Laterality Date   ABDOMINAL HYSTERECTOMY     CHOLECYSTECTOMY     FRACTURE SURGERY     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   Chronic migraine 04/14/2020   Left cervical radiculopathy 04/30/2019   Left-sided low back pain with sciatica 02/04/2019   Left elbow pain 01/16/2017   Cervical spondylosis without myelopathy 02/10/2016   Metatarsalgia of both feet 02/02/2015   Gastroesophageal reflux disease without esophagitis 12/18/2014   H/O Helicobacter infection 12/18/2014   History of gastric ulcer 12/18/2014   Essential hypertension 03/04/2014    ONSET DATE: 04/02/2024 date of referral  REFERRING DIAG: R49.0 (ICD-10-CM) - Dysphonia   THERAPY DIAG:  Dysphonia  Rationale for Evaluation and Treatment Rehabilitation  SUBJECTIVE:   PERTINENT HISTORY: Pt is a 63 year old female who was referred by her ENT for chief compliant of hoarseness  reported as "loss of voice and raspy and moderate-severe in severity. She has associated dry mouth, throat clearing, and throat pain (mild). The hoarseness has been present for 3 months. The hoarseness occurs all the time. The hoarseness developed gradually (months) and has been worsening."   Laryngoscopy on 04/02/2024 revealed mild bowing and mild edema  A chart review reveals documentation of voice hoarseness and frequent throat clearing in the following notes dated:  02/27/2023 06/21/2023 12/14/2023  "Patient has persistent dry cough and throat clearing, likely due to post nasal drip +/- GERD.   Voice hoarseness - Patient has dry cough along with voice hoarseness. Mostly likely cause is from PND symptoms and cough. Reflux could also be contributor. If not better with allergy medication consider trial off DPI and/or referral to ENT.  Gastroesophageal reflux disease without esophagitis - Hx H Pylori and gastric ulcer. No overt reflux symptoms except for cough/voice hoarseness. Patient is compliant with Prilosec 40mg  daily and GERD diet"   02/21/2024 d/t continued use of Breztri , oral thrush present - pt noncompliant with rinsing after treatment "She experiences persistent hoarseness, which she attributes to a recent flu, and finds it difficult to rest her voice. "  03/15/2024 and 03/29/2024   PAIN:  Are you having pain? No   FALLS: Has patient fallen in last 6 months? No,   LIVING ENVIRONMENT: Lives with: lives with an adult companion Lives in:  House/apartment  PLOF: Independent  PATIENT GOALS    to assess and improve vocal quality  SUBJECTIVE STATEMENT: Pt pleasant, reports she received a new reflux prescription Pt accompanied by: self  OBJECTIVE:   TODAY'S TREATMENT:  Skilled treatment session targeted pt's dysphonia goals. SLP facilitated session by providing the following interventions:  Pt continues to report increased success with vocal rest and subjectively  improved dysphonia during brief utterances in response to SLP questions. Much less raspy, gravely phonation observed  She reports that she is getting a prescription for Omeprazole from ENT, reports raising the head of her bed as well.   Pt with much fewer throat clears throughout the entire session - 3 total, she brought in water to sip on and had throat lozenges, she was also more aware of the throat clears and drank water in response.   Skilled verbal, written and tactile cues/instruction provided on abdominal breathing to support phonation. Pt was very responsive but had difficulty replicating. HEP assigned (including laying on bed with book.cell phone on her belly to watch for belly rising/falling during inhalation/exhalation respectively.)   PATIENT EDUCATION: Education details: vocal rest, see the above Person educated: Patient Education method: Explanation Education comprehension: needs further education   HOME EXERCISE PROGRAM:  Practice aggressive vocal rest  Throat clearing suppression strategies  Abdominal breathing exercises     GOALS: Goals reviewed with patient? Yes  SHORT TERM GOALS: Target date: 10 sessions  The patient will eliminate phonotraumatic behaviors such as chronic throat clearing, by substituting non-traumatic methods to clear mucus.  Baseline: Goal status: INITIAL  2.  Pt will discriminate healthy use from misuse of the voice in various situations with 80% accuracy across 3 data collections.  Baseline:  Goal status: INITIAL  3.  Pt will use abdominal breathing when producing sentences to support phonation in 80% of trials across 3 data sessions.  Baseline:  Goal status: INITIAL   LONG TERM GOALS: Target date: 06/06/2024  The patient will demonstrate independent understanding of vocal hygiene concepts.  Baseline:  Goal status: INITIAL  2.  The patient will be independent for abdominal breathing and breath support exercises.  Baseline:  Goal  status: INITIAL  3.  Patient will report improved communication effectiveness as measured by PROM Baseline:  Goal status: INITIAL   ASSESSMENT:  CLINICAL IMPRESSION: Patient is a 63 y.o. 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 In addition, pt with habitual throat clear and intermittent voluntary whispering.   Pt responsive to the above recommended strategies and information. She voiced understanding. See the above treatment note for details.   OBJECTIVE IMPAIRMENTS include voice disorder. These impairments are limiting patient from effectively communicating at home and in community and returning to singing. Factors affecting potential to achieve goals and functional outcome are previous level of function, severity of impairments, and time post onset/chronic nature of dysphonia and throat clearing/ intermittent report of reduced compliance with recommendations. Patient will benefit from skilled SLP services to address above impairments and improve overall function.  REHAB POTENTIAL: Good  PLAN: SLP FREQUENCY: 1-2x/week  SLP DURATION: 8 weeks  PLANNED INTERVENTIONS: Functional tasks, SLP instruction and feedback, and Compensatory strategies    Terina Mcelhinny B. Garlin Junker, M.S., CCC-SLP, Tree surgeon Certified Brain Injury Specialist [facility]  [facility] Rehabilitation Services Office [phone] Ascom [phone] Fax [phone]

## 2024-04-24 ENCOUNTER — Ambulatory Visit: Admitting: Speech Pathology

## 2024-04-25 ENCOUNTER — Ambulatory Visit: Admitting: Speech Pathology

## 2024-04-29 ENCOUNTER — Ambulatory Visit: Admitting: Speech Pathology

## 2024-04-29 DIAGNOSIS — R490 Dysphonia: Secondary | ICD-10-CM

## 2024-04-29 NOTE — Therapy (Signed)
 OUTPATIENT SPEECH LANGUAGE PATHOLOGY  VOICE TREATMENT NOTE   Patient Name: Valerie Hopkins MRN: [id number] DOB:[birth year], 62 y.o., female Today's Date: 04/29/2024  PCP: Jerrlyn Morel, MD REFERRING PROVIDER: Lesly Raspberry, MD   End of Session - 04/29/24 1021     Visit Number 5    Number of Visits 17    Date for SLP Re-Evaluation 06/06/24    Authorization Type [employer] Medicare    Authorization Time Period 04/11/2024 thru 06/06/2024    Authorization - Visit Number 5    Authorization - Number of Visits 17    Progress Note Due on Visit 10    SLP Start Time 1015    SLP Stop Time  1050    SLP Time Calculation (min) 35 min    Activity Tolerance Patient tolerated treatment well             Past Medical History:  Diagnosis Date   Hypertension    Migraine    Past Surgical History:  Procedure Laterality Date   ABDOMINAL HYSTERECTOMY     CHOLECYSTECTOMY     FRACTURE SURGERY     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   Chronic migraine 04/14/2020   Left cervical radiculopathy 04/30/2019   Left-sided low back pain with sciatica 02/04/2019   Left elbow pain 01/16/2017   Cervical spondylosis without myelopathy 02/10/2016   Metatarsalgia of both feet 02/02/2015   Gastroesophageal reflux disease without esophagitis 12/18/2014   H/O Helicobacter infection 12/18/2014   History of gastric ulcer 12/18/2014   Essential hypertension 03/04/2014    ONSET DATE: 04/02/2024 date of referral  REFERRING DIAG: R49.0 (ICD-10-CM) - Dysphonia   THERAPY DIAG:  Dysphonia  Rationale for Evaluation and Treatment Rehabilitation  SUBJECTIVE:   PERTINENT HISTORY: Pt is a 63 year old female who was referred by her ENT for chief compliant of hoarseness  reported as "loss of voice and raspy and moderate-severe in severity. She has associated dry mouth, throat clearing, and throat pain (mild). The hoarseness has been present for 3 months. The hoarseness occurs all the time. The hoarseness developed gradually (months) and has been worsening."   Laryngoscopy on 04/02/2024 revealed mild bowing and mild edema  A chart review reveals documentation of voice hoarseness and frequent throat clearing in the following notes dated:  02/27/2023 06/21/2023 12/14/2023  "Patient has persistent dry cough and throat clearing, likely due to post nasal drip +/- GERD.   Voice hoarseness - Patient has dry cough along with voice hoarseness. Most likely cause is from PND symptoms and cough. Reflux could also be contributor. If not better with allergy medication consider trial off DPI and/or referral to ENT.  Gastroesophageal reflux disease without esophagitis - Hx H Pylori and gastric ulcer. No overt reflux symptoms except for cough/voice hoarseness. Pt is compliant with Prilosec 40mg  daily and GERD diet"   02/21/2024 d/t continued use of Breztri , oral thrush present - pt noncompliant with rinsing after treatment "She experiences persistent hoarseness, which she attributes to a recent flu, and finds it difficult to rest her voice. "  03/15/2024 and 03/29/2024   PAIN:  Are you having pain? No   FALLS: Has patient fallen in last 6 months? No,   LIVING ENVIRONMENT: Lives with: lives with an adult companion Lives in:  House/apartment  PLOF: Independent  PATIENT GOALS    to assess and improve vocal quality  SUBJECTIVE STATEMENT: Pt pleasant, reports she visited urgent care d/t increased knee pain with driving her niece's graduation Pt accompanied by: self  OBJECTIVE:   TODAY'S TREATMENT:  Skilled treatment session targeted pt's dysphonia goals. SLP facilitated session by providing the following interventions:  Pt continues to report increased success  with vocal rest and subjectively improved dysphonia  - pt's vocal quality was improved today as evidenced by pitch obtainment on Voice Analysis  of 174 Hz and 76 dB during production of "ah" with moderate faded to minimal A vebal cues to improve respiratory support. Able to progress to longer utterances including simple conversational responses without decline in vocal quality. Pt with noted throat clear as if preparing to produce phonation with this writer suspecting that pt cleared throat as habit before "performing." Pt with increased insight and voiced agreement with statement. As a result she was aware and attempted to prevent throat clearing by drinking water.    PATIENT EDUCATION: Education details: vocal rest, see the above Person educated: Patient Education method: Explanation Education comprehension: needs further education   HOME EXERCISE PROGRAM:  Practice aggressive vocal rest  Throat clearing suppression strategies  Abdominal breathing exercises     GOALS: Goals reviewed with patient? Yes  SHORT TERM GOALS: Target date: 10 sessions  The patient will eliminate phonotraumatic behaviors such as chronic throat clearing, by substituting non-traumatic methods to clear mucus.  Baseline: Goal status: INITIAL  2.  Pt will discriminate healthy use from misuse of the voice in various situations with 80% accuracy across 3 data collections.  Baseline:  Goal status: INITIAL  3.  Pt will use abdominal breathing when producing sentences to support phonation in 80% of trials across 3 data sessions.  Baseline:  Goal status: INITIAL   LONG TERM GOALS: Target date: 06/06/2024  The patient will demonstrate independent understanding of vocal hygiene concepts.  Baseline:  Goal status: INITIAL  2.  The patient will be independent for abdominal breathing and breath support exercises.  Baseline:  Goal status: INITIAL  3.  Patient will report improved communication effectiveness as  measured by PROM Baseline:  Goal status: INITIAL   ASSESSMENT:  CLINICAL IMPRESSION: Patient is a 63 y.o. 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 In addition, pt with habitual throat clear and intermittent voluntary whispering.   Pt responsive to the above recommended strategies and information. As a result she made great progress towards improved vocal quality during today's session. Continue to recommend vocal rest to promote further healing of edema. See the above treatment note for details.   OBJECTIVE IMPAIRMENTS include voice disorder. These impairments are limiting patient from effectively communicating at home and in community and returning to singing. Factors affecting potential to achieve goals and functional outcome are previous level of function, severity of impairments, and time post onset/chronic nature of dysphonia and throat clearing/ intermittent report of reduced compliance with recommendations. Patient will benefit from skilled SLP services to address above impairments and improve overall function.  REHAB POTENTIAL: Good  PLAN: SLP FREQUENCY: 1-2x/week  SLP DURATION: 8 weeks  PLANNED INTERVENTIONS: Functional tasks, SLP instruction and feedback, and Compensatory strategies    Nickolai Rinks B. Garlin Junker, M.S., CCC-SLP, CBIS Speech-Language Pathologist Certified Brain Injury Specialist [facility]  [facility] Rehabilitation Services Office [phone] Ascom [phone] Fax [phone]

## 2024-05-01 ENCOUNTER — Ambulatory Visit: Admitting: Speech Pathology

## 2024-05-01 DIAGNOSIS — R490 Dysphonia: Secondary | ICD-10-CM

## 2024-05-01 NOTE — Therapy (Signed)
 OUTPATIENT SPEECH LANGUAGE PATHOLOGY  VOICE TREATMENT NOTE   Patient Name: Valerie Hopkins MRN: [id number] DOB:26-Jun-1961, 63 y.o., female Today's Date: 05/01/2024  PCP: Jerrlyn Morel, MD REFERRING PROVIDER: Lesly Raspberry, MD   End of Session - 05/01/24 1155     Visit Number 6    Number of Visits 17    Date for SLP Re-Evaluation 06/06/24    Authorization Type [employer] Medicare    Authorization Time Period 04/11/2024 thru 06/06/2024    Authorization - Visit Number 6    Authorization - Number of Visits 17    Progress Note Due on Visit 10    SLP Start Time 1155    SLP Stop Time  1230    SLP Time Calculation (min) 35 min    Activity Tolerance Patient tolerated treatment well             Past Medical History:  Diagnosis Date   Hypertension    Migraine    Past Surgical History:  Procedure Laterality Date   ABDOMINAL HYSTERECTOMY     CHOLECYSTECTOMY     FRACTURE SURGERY     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   Chronic migraine 04/14/2020   Left cervical radiculopathy 04/30/2019   Left-sided low back pain with sciatica 02/04/2019   Left elbow pain 01/16/2017   Cervical spondylosis without myelopathy 02/10/2016   Metatarsalgia of both feet 02/02/2015   Gastroesophageal reflux disease without esophagitis 12/18/2014   H/O Helicobacter infection 12/18/2014   History of gastric ulcer 12/18/2014   Essential hypertension 03/04/2014    ONSET DATE: 04/02/2024 date of referral  REFERRING DIAG: R49.0 (ICD-10-CM) - Dysphonia   THERAPY DIAG:  Dysphonia  Rationale for Evaluation and Treatment Rehabilitation  SUBJECTIVE:   PERTINENT HISTORY: Pt is a 63 year old female who was referred by her ENT for chief compliant of hoarseness  reported as loss of voice and raspy and moderate-severe in severity. She has associated dry mouth, throat clearing, and throat pain (mild). The hoarseness has been present for 3 months. The hoarseness occurs all the time. The hoarseness developed gradually (months) and has been worsening.   Laryngoscopy on 04/02/2024 revealed mild bowing and mild edema  A chart review reveals documentation of voice hoarseness and frequent throat clearing in the following notes dated:  02/27/2023 06/21/2023 12/14/2023  Patient has persistent dry cough and throat clearing, likely due to post nasal drip +/- GERD.   Voice hoarseness - Patient has dry cough along with voice hoarseness. Most likely cause is from PND symptoms and cough. Reflux could also be contributor. If not better with allergy medication consider trial off DPI and/or referral to ENT.  Gastroesophageal reflux disease without esophagitis - Hx H Pylori and gastric ulcer. No overt reflux symptoms except for cough/voice hoarseness. Pt is compliant with Prilosec 40mg  daily and GERD diet   02/21/2024 d/t continued use of Breztri , oral thrush present - pt noncompliant with rinsing after treatment She experiences persistent hoarseness, which she attributes to a recent flu, and finds it difficult to rest her voice.   03/15/2024 and 03/29/2024   PAIN:  Are you having pain? No   FALLS: Has patient fallen in last 6 months? No,   LIVING ENVIRONMENT: Lives with: lives with an adult companion Lives in:  House/apartment  PLOF: Independent  PATIENT GOALS    to assess and improve vocal quality  SUBJECTIVE STATEMENT: Pt arrived late for session d/t being at PCP Pt accompanied by: self  OBJECTIVE:   TODAY'S TREATMENT:  Skilled treatment session targeted pt's dysphonia goals. SLP facilitated session by providing the following interventions:  Per pt report, she continues to practice voice rest, is compliant with PPI and active suppression of  throat clears. She tends to have off-the-cuff whispering during greetings. Education provided using full voice with good respiratory support.   Skilled verbal and written instructions provided on advancing HEP to include vocal warm-ups and voice building exercises. With Min A faded to Mod I pt able to perform humming (sustain, pitch glides, humming Happy Birthday) and with rare superviison cues, pt able to produce sustained ah, counting and reading functional phrases with (per pt report) my normal voice  VOICE HANDICAP INDEX (VHI)  The Voice Handicap Index is comprised of a series of questions to assess the patient's perception of their voice. It is designed to evaluate the emotional, physical and functional components of the voice problem.  Functional: 21 Physical: 15 Emotional: 8 Total: 44 (Normal mean 8.75, SD =14.97)  z score =  2.4  moderate = 2.00-2.99  This is much improved over score during pt's evaluation (04/17/2024) of 5.7 (severe)  PATIENT EDUCATION: Education details: vocal rest, see the above Person educated: Patient Education method: Explanation Education comprehension: needs further education   HOME EXERCISE PROGRAM:  Practice aggressive vocal rest  Throat clearing suppression strategies  Abdominal breathing exercises     GOALS: Goals reviewed with patient? Yes  SHORT TERM GOALS: Target date: 10 sessions  The patient will eliminate phonotraumatic behaviors such as chronic throat clearing, by substituting non-traumatic methods to clear mucus.  Baseline: Goal status: INITIAL  2.  Pt will discriminate healthy use from misuse of the voice in various situations with 80% accuracy across 3 data collections.  Baseline:  Goal status: INITIAL  3.  Pt will use abdominal breathing when producing sentences to support phonation in 80% of trials across 3 data sessions.  Baseline:  Goal status: INITIAL   LONG TERM GOALS: Target date: 06/06/2024  The patient  will demonstrate independent understanding of vocal hygiene concepts.  Baseline:  Goal status: INITIAL  2.  The patient will be independent for abdominal breathing and breath support exercises.  Baseline:  Goal status: INITIAL  3.  Patient will report improved communication effectiveness as measured by PROM Baseline:  Goal status: INITIAL   ASSESSMENT:  CLINICAL IMPRESSION: Patient is a 63 y.o. 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 In addition, pt with habitual throat clear and intermittent voluntary whispering.   Pt responsive to the above recommended strategies and information. As a result she made great progress towards improved vocal quality during today's session.  See the above treatment note for details.   OBJECTIVE IMPAIRMENTS include voice disorder. These impairments are limiting patient from effectively communicating at home and in community and returning to singing. Factors affecting potential to achieve goals and functional outcome are previous level of function, severity of impairments, and time post onset/chronic nature of dysphonia and throat clearing/ intermittent report of reduced compliance with recommendations. Patient will benefit from skilled SLP services to address above impairments and improve overall function.  REHAB POTENTIAL:  Good  PLAN: SLP FREQUENCY: 1-2x/week  SLP DURATION: 8 weeks  PLANNED INTERVENTIONS: Functional tasks, SLP instruction and feedback, and Compensatory strategies    Hashem Goynes B. Garlin Junker, M.S., CCC-SLP, Tree surgeon Certified Brain Injury Specialist [facility]  [facility] Rehabilitation Services Office [phone] Ascom [phone] Fax [phone]

## 2024-05-06 ENCOUNTER — Ambulatory Visit: Admitting: Speech Pathology

## 2024-05-09 ENCOUNTER — Ambulatory Visit (INDEPENDENT_AMBULATORY_CARE_PROVIDER_SITE_OTHER): Admitting: Pulmonary Disease

## 2024-05-09 ENCOUNTER — Encounter: Payer: Self-pay | Admitting: Pulmonary Disease

## 2024-05-09 VITALS — BP 116/76 | HR 91 | Temp 98.2°F | Ht 62.0 in | Wt 209.8 lb

## 2024-05-09 DIAGNOSIS — J4540 Moderate persistent asthma, uncomplicated: Secondary | ICD-10-CM

## 2024-05-09 DIAGNOSIS — K219 Gastro-esophageal reflux disease without esophagitis: Secondary | ICD-10-CM

## 2024-05-09 DIAGNOSIS — G4733 Obstructive sleep apnea (adult) (pediatric): Secondary | ICD-10-CM

## 2024-05-09 DIAGNOSIS — Z91199 Patient's noncompliance with other medical treatment and regimen due to unspecified reason: Secondary | ICD-10-CM

## 2024-05-09 MED ORDER — ALBUTEROL SULFATE HFA 108 (90 BASE) MCG/ACT IN AERS: 2.0000 | INHALATION_SPRAY | Freq: Four times a day (QID) | RESPIRATORY_TRACT | 2 refills | Status: DC | PRN

## 2024-05-09 NOTE — Progress Notes (Unsigned)
 Subjective:    Patient ID: Valerie Hopkins, female    DOB: Dec 14, 1960, 63 y.o.   MRN: [id number]  Patient Care Team: Al Hover, MD as PCP - General (Family Medicine) Marc Senior, MD as Consulting Physician (Pulmonary Disease)  Chief Complaint  Patient presents with   Follow-up    Shortness of breath on exertion.     BACKGROUND/INTERVAL:Patient is a 63 year old lifelong never smoker who follows for the issue of shortness of breath cough and wheezing in the setting of moderate persistent asthma.  She has also gastroesophageal reflux and recently diagnosed mild sleep apnea with significant symptom burden.  Patient's last interaction with the clinic was on 21 February 2024.   HPI    Review of Systems A 10 point review of systems was performed and it is as noted above otherwise negative.   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   Chronic migraine 04/14/2020   Left cervical radiculopathy 04/30/2019   Left-sided low back pain with sciatica 02/04/2019   Left elbow pain 01/16/2017   Cervical spondylosis without myelopathy 02/10/2016   Metatarsalgia of both feet 02/02/2015   Gastroesophageal reflux disease without esophagitis 12/18/2014   H/O Helicobacter infection 12/18/2014   History of gastric ulcer 12/18/2014   Essential hypertension 03/04/2014    Social History   Tobacco Use   Smoking status: Never   Smokeless tobacco: Never  Substance Use Topics   Alcohol use: Never    Allergies  Allergen Reactions   Naproxen Other (See Comments)   Ibuprofen Nausea Only    Other reaction(s): Other (See Comments) GI issues   Ciprofloxacin Swelling   Other Itching   Codeine Nausea And Vomiting    Other reaction(s): Vomiting    Lisinopril Cough   Oxycodone -Acetaminophen  Itching    Current Meds  Medication Sig   acetaminophen  (TYLENOL ) 500 MG tablet Take by mouth. Take 500 mg by mouth every 6 (six) hours as needed for Pain   albuterol  (PROVENTIL ) (2.5 MG/3ML) 0.083% nebulizer solution Take 3 mLs (2.5 mg total) by nebulization every 6 (six) hours as needed for wheezing or shortness of breath.   atorvastatin (LIPITOR) 10 MG tablet Take 10 mg by mouth daily.   Budeson-Glycopyrrol-Formoterol (BREZTRI  AEROSPHERE) 160-9-4.8 MCG/ACT AERO Inhale 2 puffs into the lungs in the morning and at bedtime.   cholecalciferol (VITAMIN D3) 25 MCG (1000 UNIT) tablet Take 1,000 Units by mouth daily.   fluticasone  (FLONASE ) 50 MCG/ACT nasal spray Place 1 spray into both nostrils daily.   Folic Acid-Vit B6-Vit B12 (FOLBEE) 2.5-25-1 MG TABS tablet Take 1 tablet by mouth daily.   furosemide (LASIX) 20 MG tablet Take 20 mg by mouth daily as needed.   gabapentin  (NEURONTIN ) 600 MG tablet Take 600 mg by mouth daily.   losartan-hydrochlorothiazide (HYZAAR) 100-25 MG tablet Take 1 tablet by mouth every morning.   methocarbamol  (ROBAXIN ) 750 MG tablet Take 750 mg by mouth 2 (two) times daily as needed.   montelukast  (SINGULAIR ) 10 MG tablet Take 1 tablet (10 mg total) by mouth at bedtime.   Multiple Vitamin (MULTIVITAMIN ADULT PO) Take by mouth.   mupirocin  ointment (BACTROBAN ) 2 % Apply two times a day for 7 days.   naproxen (NAPROSYN) 500 MG tablet Take 500 mg by mouth 2 (  two) times daily.   SUMAtriptan (IMITREX) 25 MG tablet Take 25 mg by mouth as needed.   topiramate (TOPAMAX) 50 MG tablet Take by mouth. Take 3 tablets (150 mg total) by mouth at bedtime Stop rx for 200mg    triamcinolone  cream (KENALOG ) 0.1 % Apply 1 application topically 2 (two) times daily.    Immunization History  Administered Date(s) Administered   Dtap, Unspecified 06/28/1967, 07/26/1967, 10/11/1967, 02/20/1975   Fluad Quad(high Dose 65+) 07/22/2022   Influenza,  Quadrivalent, Recombinant, Inj, Pf 09/01/2022   Influenza, Seasonal, Injecte, Preservative Fre 06/24/2023   Influenza,inj,Quad PF,6+ Mos 08/19/2016, 09/26/2017, 08/18/2021   Influenza-Unspecified 08/08/2013, 08/08/2013, 08/11/2014, 08/11/2014, 08/10/2015, 08/10/2015, 06/25/2019, 06/25/2019, 08/15/2020, 07/23/2023   MMR 08/26/2004, 03/02/2011   Moderna Sars-Covid-2 Vaccination 01/17/2020, 02/14/2020, 09/22/2020, 04/29/2021   PNEUMOCOCCAL CONJUGATE-20 06/24/2023   PPD Test 01/01/2018   Pfizer Covid-19 Vaccine Bivalent Booster 63yrs & up 01/18/2022   Polio, Unspecified 06/28/1967, 10/11/1967, 02/20/1975   Respiratory Syncytial Virus Vaccine,Recomb Aduvanted(Arexvy) 07/22/2022, 09/01/2022   Td (Adult),unspecified 06/25/2007, 02/15/2022   Tdap 11/21/2010, 01/01/2018, 02/15/2022   Zoster Recombinant(Shingrix) 04/14/2020, 11/23/2020        Objective:     BP 116/76 (BP Location: Left Arm, Patient Position: Sitting, Cuff Size: Large)   Pulse 91   Temp 98.2 F (36.8 C) (Oral)   Ht 5' 2 (1.575 m)   Wt 209 lb 12.8 oz (95.2 kg)   SpO2 98%   BMI 38.37 kg/m   SpO2: 98 %  GENERAL: Overweight woman, ambulatory with assistance of a cane. No conversational dyspnea.  No acute distress. HEAD: Normocephalic, atraumatic.  EYES: Pupils equal, round, reactive to light.  No scleral icterus.  MOUTH: Oral mucosa moist.  + Mild oropharyngeal thrush. NECK: Supple. No thyromegaly. Trachea midline. No JVD.  No adenopathy. PULMONARY: Good air entry bilaterally.  No adventitious sounds. CARDIOVASCULAR: S1 and S2. Regular rate and rhythm.  No rubs, murmurs or gallops heard.   ABDOMEN: Benign. MUSCULOSKELETAL: No joint deformity, no clubbing, 2+ edema left lower extremity compared to right, no redness or warmth.  No palpable cords. NEUROLOGIC: Grossly nonfocal.  Gait not tested. SKIN: Intact,warm,dry.  Venous stasis changes on the left lower extremity PSYCH: Mood and behavior normal.        Assessment  & Plan:     ICD-10-CM   1. Moderate persistent asthma without complication  J45.40     2. Gastroesophageal reflux disease, unspecified whether esophagitis present  K21.9     3. OSA (obstructive sleep apnea)  G47.33     4. Noncompliance with CPAP treatment  Z91.199       No orders of the defined types were placed in this encounter.   No orders of the defined types were placed in this encounter.      Advised if symptoms do not improve or worsen, to please contact office for sooner follow up or seek emergency care.    I spent xxx minutes of dedicated to the care of this patient on the date of this encounter to include pre-visit review of records, face-to-face time with the patient discussing conditions above, post visit ordering of testing, clinical documentation with the electronic health record, making appropriate referrals as documented, and communicating necessary findings to members of the patients care team.     C. Chloe Counter, MD Advanced Bronchoscopy PCCM [facility]    *This note was generated using voice recognition software/Dragon and/or AI transcription program.  Despite best efforts to proofread, errors can occur which can change the  meaning. Any transcriptional errors that result from this process are unintentional and may not be fully corrected at the time of dictation.

## 2024-05-09 NOTE — Patient Instructions (Signed)
 VISIT SUMMARY:  Today, we discussed your ongoing issues with moderate persistent asthma and mild obstructive sleep apnea. We reviewed your current treatments and made some adjustments to improve your comfort and compliance with your therapy.  YOUR PLAN:  -MODERATE PERSISTENT ASTHMA: Moderate persistent asthma is a condition where your airways are inflamed and narrowed, causing difficulty in breathing. You should continue using Breztri  for management and rinse your mouth with water and baking soda after each use to prevent hoarseness. A new prescription for your rescue inhaler has been provided.  -OBSTRUCTIVE SLEEP APNEA, MILD: Mild obstructive sleep apnea is a condition where your breathing repeatedly stops and starts during sleep. To improve your comfort and compliance with CPAP therapy, we will provide you with a nasal prong mask and adjust the CPAP pressure settings to a lower level. We will also ensure that the new mask is compatible with your ResMed Airsense machine.  INSTRUCTIONS:  Please follow up with us  if you experience any issues with the new CPAP mask or settings. Continue with your speech therapy sessions as they are beneficial for your vocal cord dysfunction. Use your rescue inhaler as prescribed and rinse your mouth after using Breztri . If you have any further concerns or questions, do not hesitate to contact our office.

## 2024-05-13 ENCOUNTER — Encounter: Payer: Self-pay | Admitting: Pulmonary Disease

## 2024-05-15 ENCOUNTER — Ambulatory Visit: Admitting: Speech Pathology

## 2024-05-16 ENCOUNTER — Ambulatory Visit: Admitting: Speech Pathology

## 2024-05-16 DIAGNOSIS — R490 Dysphonia: Secondary | ICD-10-CM | POA: Diagnosis not present

## 2024-05-16 NOTE — Therapy (Signed)
 OUTPATIENT SPEECH LANGUAGE PATHOLOGY  VOICE TREATMENT NOTE   Patient Name: Valerie Hopkins MRN: [id number] DOB:04/26/1961, 63 y.o., female Today's Date: 05/16/2024  PCP: Alyce Centers, MD REFERRING PROVIDER: Chinita Hasten, MD   End of Session - 05/16/24 0850     Visit Number 7    Number of Visits 17    Date for SLP Re-Evaluation 06/06/24    Authorization Type [employer] Medicare    Authorization Time Period 04/11/2024 thru 06/06/2024    Authorization - Visit Number 7    Authorization - Number of Visits 17    Progress Note Due on Visit 10    SLP Start Time 0850    SLP Stop Time  0930    SLP Time Calculation (min) 40 min    Activity Tolerance Patient tolerated treatment well          Past Medical History:  Diagnosis Date   Hypertension    Migraine    Past Surgical History:  Procedure Laterality Date   ABDOMINAL HYSTERECTOMY     CHOLECYSTECTOMY     FRACTURE SURGERY     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   Chronic migraine 04/14/2020   Left cervical radiculopathy 04/30/2019   Left-sided low back pain with sciatica 02/04/2019   Left elbow pain 01/16/2017   Cervical spondylosis without myelopathy 02/10/2016   Metatarsalgia of both feet 02/02/2015   Gastroesophageal reflux disease without esophagitis 12/18/2014   H/O Helicobacter infection 12/18/2014   History of gastric ulcer 12/18/2014   Essential hypertension 03/04/2014    ONSET DATE: 04/02/2024 date of referral  REFERRING DIAG: R49.0 (ICD-10-CM) - Dysphonia   THERAPY DIAG:  Dysphonia  Rationale for Evaluation and Treatment Rehabilitation  SUBJECTIVE:   PERTINENT HISTORY: Pt is a 63 year old female who was referred by her ENT for chief compliant of hoarseness  reported as loss of voice and raspy and moderate-severe in severity. She has associated dry mouth, throat clearing, and throat pain (mild). The hoarseness has been present for 3 months. The hoarseness occurs all the time. The hoarseness developed gradually (months) and has been worsening.   Laryngoscopy on 04/02/2024 revealed mild bowing and mild edema  A chart review reveals documentation of voice hoarseness and frequent throat clearing in the following notes dated:  02/27/2023 06/21/2023 12/14/2023  Patient has persistent dry cough and throat clearing, likely due to post nasal drip +/- GERD.   Voice hoarseness - Patient has dry cough along with voice hoarseness. Most likely cause is from PND symptoms and cough. Reflux could also be contributor. If not better with allergy medication consider trial off DPI and/or referral to ENT.  Gastroesophageal reflux disease without esophagitis - Hx H Pylori and gastric ulcer. No overt reflux symptoms except for cough/voice hoarseness. Pt is compliant with Prilosec 40mg  daily and GERD diet   02/21/2024 d/t continued use of Breztri , oral thrush present - pt noncompliant with rinsing after treatment She experiences persistent hoarseness, which she attributes to a recent flu, and finds it difficult to rest her voice.   03/15/2024 and 03/29/2024   PAIN:  Are you having pain? No   FALLS: Has patient fallen in last 6 months? No,   LIVING ENVIRONMENT: Lives with: lives with an adult companion Lives in: House/apartment  PLOF:  Independent  PATIENT GOALS    to assess and improve vocal quality  SUBJECTIVE STATEMENT: Pt arrived to session very eager, reports great compliance with HEP Pt accompanied by: self  OBJECTIVE:   TODAY'S TREATMENT:  Skilled treatment session targeted pt's dysphonia goals. SLP facilitated session by providing the following interventions:  Pt continues with much improved vocal quality that is c/b strong, good vocal  intensity (77 dB). During conversation, pt's pitch continues to be improved to 203 Hz. She was not observed with any throat clearing but reports that she continues to have sensation of phlegm after swallowing. Pt instructed to produced a brief ah and swallow. She reports that this was helpful in reducing the mucus.   Pt with In the mornings, I sound like a frog - encouraged pt to perform vocal warm-ups before engaging in lengthy conversation with her sister in the morning as well as starting hydration earlier in the day.   Written instructions provided for pt as she desires to start singing. These instructions targeted slow progress starting with a verse or two and progressing to full song over the course of several days.     PATIENT EDUCATION: Education details: see the above Person educated: Patient Education method: Explanation Education comprehension: needs further education   HOME EXERCISE PROGRAM:  Practice aggressive vocal rest  Throat clearing suppression strategies  Abdominal breathing exercises     GOALS: Goals reviewed with patient? Yes  SHORT TERM GOALS: Target date: 10 sessions  The patient will eliminate phonotraumatic behaviors such as chronic throat clearing, by substituting non-traumatic methods to clear mucus.  Baseline: Goal status: INITIAL  2.  Pt will discriminate healthy use from misuse of the voice in various situations with 80% accuracy across 3 data collections.  Baseline:  Goal status: INITIAL  3.  Pt will use abdominal breathing when producing sentences to support phonation in 80% of trials across 3 data sessions.  Baseline:  Goal status: INITIAL   LONG TERM GOALS: Target date: 06/06/2024  The patient will demonstrate independent understanding of vocal hygiene concepts.  Baseline:  Goal status: INITIAL  2.  The patient will be independent for abdominal breathing and breath support exercises.  Baseline:  Goal status: INITIAL  3.   Patient will report improved communication effectiveness as measured by PROM Baseline:  Goal status: INITIAL   ASSESSMENT:  CLINICAL IMPRESSION: Patient is a 63 y.o. 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 In addition, pt with habitual throat clear and intermittent voluntary whispering.   Pt continues to make great progress towards her goals.  See the above treatment note for details.   OBJECTIVE IMPAIRMENTS include voice disorder. These impairments are limiting patient from effectively communicating at home and in community and returning to singing. Factors affecting potential to achieve goals and functional outcome are previous level of function, severity of impairments, and time post onset/chronic nature of dysphonia and throat clearing/ intermittent report of reduced compliance with recommendations. Patient will benefit from skilled SLP services to address above impairments and improve overall function.  REHAB POTENTIAL: Good  PLAN: SLP FREQUENCY: 1-2x/week  SLP DURATION: 8 weeks  PLANNED INTERVENTIONS: Functional tasks, SLP instruction and feedback, and Compensatory strategies    Eliyanah Elgersma B. Rubbie, M.S., CCC-SLP, CBIS Speech-Language Pathologist Certified Brain Injury Specialist [place]  [facility] Rehabilitation Services Office  [phone] Ascom [phone] Fax [phone]

## 2024-05-20 ENCOUNTER — Ambulatory Visit: Admitting: Speech Pathology

## 2024-05-20 DIAGNOSIS — R490 Dysphonia: Secondary | ICD-10-CM | POA: Diagnosis not present

## 2024-05-20 NOTE — Therapy (Signed)
 OUTPATIENT SPEECH LANGUAGE PATHOLOGY  VOICE TREATMENT NOTE   Patient Name: Valerie Hopkins MRN: [id number] DOB:1961-09-25, 63 y.o., female Today's Date: 05/20/2024  PCP: Alyce Centers, MD REFERRING PROVIDER: Chinita Hasten, MD   End of Session - 05/20/24 1017     Visit Number 8    Number of Visits 17    Date for SLP Re-Evaluation 06/06/24    Authorization Type [employer] Medicare    Authorization Time Period 04/11/2024 thru 06/06/2024    Authorization - Visit Number 8    Authorization - Number of Visits 17    Progress Note Due on Visit 10    SLP Start Time 1015    SLP Stop Time  1045    SLP Time Calculation (min) 30 min    Activity Tolerance Patient tolerated treatment well          Past Medical History:  Diagnosis Date   Hypertension    Migraine    Past Surgical History:  Procedure Laterality Date   ABDOMINAL HYSTERECTOMY     CHOLECYSTECTOMY     FRACTURE SURGERY     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   Chronic migraine 04/14/2020   Left cervical radiculopathy 04/30/2019   Left-sided low back pain with sciatica 02/04/2019   Left elbow pain 01/16/2017   Cervical spondylosis without myelopathy 02/10/2016   Metatarsalgia of both feet 02/02/2015   Gastroesophageal reflux disease without esophagitis 12/18/2014   H/O Helicobacter infection 12/18/2014   History of gastric ulcer 12/18/2014   Essential hypertension 03/04/2014    ONSET DATE: 04/02/2024 date of referral  REFERRING DIAG: R49.0 (ICD-10-CM) - Dysphonia   THERAPY DIAG:  Dysphonia  Rationale for Evaluation and Treatment Rehabilitation  SUBJECTIVE:   PERTINENT HISTORY: Pt is a 63 year old female who was referred by her ENT for chief compliant of hoarseness  reported as loss of voice and raspy and moderate-severe in severity. She has associated dry mouth, throat clearing, and throat pain (mild). The hoarseness has been present for 3 months. The hoarseness occurs all the time. The hoarseness developed gradually (months) and has been worsening.   Laryngoscopy on 04/02/2024 revealed mild bowing and mild edema  A chart review reveals documentation of voice hoarseness and frequent throat clearing in the following notes dated:  02/27/2023 06/21/2023 12/14/2023  Patient has persistent dry cough and throat clearing, likely due to post nasal drip +/- GERD.   Voice hoarseness - Patient has dry cough along with voice hoarseness. Most likely cause is from PND symptoms and cough. Reflux could also be contributor. If not better with allergy medication consider trial off DPI and/or referral to ENT.  Gastroesophageal reflux disease without esophagitis - Hx H Pylori and gastric ulcer. No overt reflux symptoms except for cough/voice hoarseness. Pt is compliant with Prilosec 40mg  daily and GERD diet   02/21/2024 d/t continued use of Breztri , oral thrush present - pt noncompliant with rinsing after treatment She experiences persistent hoarseness, which she attributes to a recent flu, and finds it difficult to rest her voice.   03/15/2024 and 03/29/2024   PAIN:  Are you having pain? No   FALLS: Has patient fallen in last 6 months? No,   LIVING ENVIRONMENT: Lives with: lives with an adult companion Lives in: House/apartment  PLOF:  Independent  PATIENT GOALS    to assess and improve vocal quality  SUBJECTIVE STATEMENT: Pt arrived to session very eager, reports improved ability when singing over the weekened Pt accompanied by: self  OBJECTIVE:   TODAY'S TREATMENT:  Skilled treatment session targeted pt's dysphonia goals. SLP facilitated session by providing the following interventions:  Pt continues with much improved vocal quality that is c/b  strong, good vocal intensity - she reports improved ability when singing as well - during conversational speech (45 minutes) pt was able to maintain continued improved vocal quality and intensity independently  PATIENT EDUCATION: Education details: see the above Person educated: Patient Education method: Explanation Education comprehension: needs further education   HOME EXERCISE PROGRAM:  Practice aggressive vocal rest  Throat clearing suppression strategies  Abdominal breathing exercises     GOALS: Goals reviewed with patient? Yes  SHORT TERM GOALS: Target date: 10 sessions  The patient will eliminate phonotraumatic behaviors such as chronic throat clearing, by substituting non-traumatic methods to clear mucus.  Baseline: Goal status: INITIAL  2.  Pt will discriminate healthy use from misuse of the voice in various situations with 80% accuracy across 3 data collections.  Baseline:  Goal status: INITIAL  3.  Pt will use abdominal breathing when producing sentences to support phonation in 80% of trials across 3 data sessions.  Baseline:  Goal status: INITIAL   LONG TERM GOALS: Target date: 06/06/2024  The patient will demonstrate independent understanding of vocal hygiene concepts.  Baseline:  Goal status: INITIAL  2.  The patient will be independent for abdominal breathing and breath support exercises.  Baseline:  Goal status: INITIAL  3.  Patient will report improved communication effectiveness as measured by PROM Baseline:  Goal status: INITIAL   ASSESSMENT:  CLINICAL IMPRESSION: Patient is a 63 y.o. 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 In addition, pt with habitual throat clear and intermittent voluntary whispering.   Pt continues to make great progress towards her goals.  See the above treatment  note for details.   OBJECTIVE IMPAIRMENTS include voice disorder. These impairments are limiting patient from effectively communicating at home and in community and returning to singing. Factors affecting potential to achieve goals and functional outcome are previous level of function, severity of impairments, and time post onset/chronic nature of dysphonia and throat clearing/ intermittent report of reduced compliance with recommendations. Patient will benefit from skilled SLP services to address above impairments and improve overall function.  REHAB POTENTIAL: Good  PLAN: SLP FREQUENCY: 1-2x/week  SLP DURATION: 8 weeks  PLANNED INTERVENTIONS: Functional tasks, SLP instruction and feedback, and Compensatory strategies    Azuree Minish B. Rubbie, M.S., CCC-SLP, Tree surgeon Certified Brain Injury Specialist [facility]  [facility] Rehabilitation Services Office [phone] Ascom [phone] Fax [phone]

## 2024-05-22 ENCOUNTER — Ambulatory Visit: Attending: Unknown Physician Specialty | Admitting: Speech Pathology

## 2024-05-22 DIAGNOSIS — R490 Dysphonia: Secondary | ICD-10-CM | POA: Insufficient documentation

## 2024-05-22 NOTE — Therapy (Unsigned)
 OUTPATIENT SPEECH LANGUAGE PATHOLOGY  VOICE TREATMENT NOTE   Patient Name: Valerie Hopkins MRN: [id number] DOB:August 15, 1961, 63 y.o., female Today's Date: 05/22/2024  PCP: Alyce Centers, MD REFERRING PROVIDER: Chinita Hasten, MD   End of Session - 05/22/24 1124     Visit Number 9    Number of Visits 17    Date for SLP Re-Evaluation 06/06/24    Authorization Type [employer] Medicare    Authorization Time Period 04/11/2024 thru 06/06/2024    Authorization - Visit Number 9    Authorization - Number of Visits 17    Progress Note Due on Visit 10    SLP Start Time 1110    SLP Stop Time  1145    SLP Time Calculation (min) 35 min    Activity Tolerance Patient tolerated treatment well          Past Medical History:  Diagnosis Date   Hypertension    Migraine    Past Surgical History:  Procedure Laterality Date   ABDOMINAL HYSTERECTOMY     CHOLECYSTECTOMY     FRACTURE SURGERY     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   Chronic migraine 04/14/2020   Left cervical radiculopathy 04/30/2019   Left-sided low back pain with sciatica 02/04/2019   Left elbow pain 01/16/2017   Cervical spondylosis without myelopathy 02/10/2016   Metatarsalgia of both feet 02/02/2015   Gastroesophageal reflux disease without esophagitis 12/18/2014   H/O Helicobacter infection 12/18/2014   History of gastric ulcer 12/18/2014   Essential hypertension 03/04/2014    ONSET DATE: 04/02/2024 date of referral  REFERRING DIAG: R49.0 (ICD-10-CM) - Dysphonia   THERAPY DIAG:  Dysphonia  Rationale for Evaluation and Treatment Rehabilitation  SUBJECTIVE:   PERTINENT HISTORY: Pt is a 63 year old female who was referred by her ENT for chief compliant of hoarseness  reported as loss of voice and raspy and moderate-severe in severity. She has associated dry mouth, throat clearing, and throat pain (mild). The hoarseness has been present for 3 months. The hoarseness occurs all the time. The hoarseness developed gradually (months) and has been worsening.   Laryngoscopy on 04/02/2024 revealed mild bowing and mild edema  A chart review reveals documentation of voice hoarseness and frequent throat clearing in the following notes dated:  02/27/2023 06/21/2023 12/14/2023  Patient has persistent dry cough and throat clearing, likely due to post nasal drip +/- GERD.   Voice hoarseness - Patient has dry cough along with voice hoarseness. Most likely cause is from PND symptoms and cough. Reflux could also be contributor. If not better with allergy medication consider trial off DPI and/or referral to ENT.  Gastroesophageal reflux disease without esophagitis - Hx H Pylori and gastric ulcer. No overt reflux symptoms except for cough/voice hoarseness. Pt is compliant with Prilosec 40mg  daily and GERD diet   02/21/2024 d/t continued use of Breztri , oral thrush present - pt noncompliant with rinsing after treatment She experiences persistent hoarseness, which she attributes to a recent flu, and finds it difficult to rest her voice.   03/15/2024 and 03/29/2024   PAIN:  Are you having pain? No   FALLS: Has patient fallen in last 6 months? No,   LIVING ENVIRONMENT: Lives with: lives with an adult companion Lives in: House/apartment  PLOF:  Independent  PATIENT GOALS    to assess and improve vocal quality  SUBJECTIVE STATEMENT: Pt arrived to session very eager, reports improved ability when singing over the weekened Pt accompanied by: self  OBJECTIVE:   TODAY'S TREATMENT:  Skilled treatment session targeted pt's dysphonia goals. SLP facilitated session by providing the following interventions:  Pt continues with much improved vocal quality that is c/b  strong, good vocal intensity - she reports improved ability when singing as well - during conversational speech (45 minutes) pt was able to maintain continued improved vocal quality and intensity independently  PATIENT EDUCATION: Education details: see the above Person educated: Patient Education method: Explanation Education comprehension: needs further education   HOME EXERCISE PROGRAM:  Practice aggressive vocal rest  Throat clearing suppression strategies  Abdominal breathing exercises     GOALS: Goals reviewed with patient? Yes  SHORT TERM GOALS: Target date: 10 sessions  The patient will eliminate phonotraumatic behaviors such as chronic throat clearing, by substituting non-traumatic methods to clear mucus.  Baseline: Goal status: INITIAL  2.  Pt will discriminate healthy use from misuse of the voice in various situations with 80% accuracy across 3 data collections.  Baseline:  Goal status: INITIAL  3.  Pt will use abdominal breathing when producing sentences to support phonation in 80% of trials across 3 data sessions.  Baseline:  Goal status: INITIAL   LONG TERM GOALS: Target date: 06/06/2024  The patient will demonstrate independent understanding of vocal hygiene concepts.  Baseline:  Goal status: INITIAL  2.  The patient will be independent for abdominal breathing and breath support exercises.  Baseline:  Goal status: INITIAL  3.  Patient will report improved communication effectiveness as measured by PROM Baseline:  Goal status: INITIAL   ASSESSMENT:  CLINICAL IMPRESSION: Patient is a 63 y.o. 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 In addition, pt with habitual throat clear and intermittent voluntary whispering.   Pt continues to make great progress towards her goals.  See the above treatment  note for details.   OBJECTIVE IMPAIRMENTS include voice disorder. These impairments are limiting patient from effectively communicating at home and in community and returning to singing. Factors affecting potential to achieve goals and functional outcome are previous level of function, severity of impairments, and time post onset/chronic nature of dysphonia and throat clearing/ intermittent report of reduced compliance with recommendations. Patient will benefit from skilled SLP services to address above impairments and improve overall function.  REHAB POTENTIAL: Good  PLAN: SLP FREQUENCY: 1-2x/week  SLP DURATION: 8 weeks  PLANNED INTERVENTIONS: Functional tasks, SLP instruction and feedback, and Compensatory strategies    Latrease Kunde B. Rubbie, M.S., CCC-SLP, Tree surgeon Certified Brain Injury Specialist [facility]  [facility] Rehabilitation Services Office [phone] Ascom [phone] Fax [phone]

## 2024-05-27 ENCOUNTER — Ambulatory Visit: Admitting: Speech Pathology

## 2024-05-29 ENCOUNTER — Other Ambulatory Visit

## 2024-05-29 ENCOUNTER — Ambulatory Visit: Admitting: Speech Pathology

## 2024-06-03 ENCOUNTER — Ambulatory Visit: Admitting: Speech Pathology

## 2024-06-06 ENCOUNTER — Ambulatory Visit: Admitting: Speech Pathology

## 2024-06-06 DIAGNOSIS — R490 Dysphonia: Secondary | ICD-10-CM

## 2024-06-06 NOTE — Therapy (Signed)
 OUTPATIENT SPEECH LANGUAGE PATHOLOGY  VOICE TREATMENT NOTE DISCHARGE SUMMARY   Patient Name: Valerie Hopkins MRN: [id number] DOB:[birth year], 62 y.o., female Today's Date: 06/06/2024  PCP: Alyce Centers, MD REFERRING PROVIDER: Chinita Hasten, MD   End of Session - 06/06/24 0953     Visit Number 10    Number of Visits 17    Date for SLP Re-Evaluation 06/06/24    Authorization Type [employer]    Authorization Time Period 04/11/2024 thru 06/06/2024    Authorization - Visit Number 10    Authorization - Number of Visits 17    Progress Note Due on Visit 10    SLP Start Time 0950    SLP Stop Time  1015    SLP Time Calculation (min) 25 min          Past Medical History:  Diagnosis Date   Hypertension    Migraine    Past Surgical History:  Procedure Laterality Date   ABDOMINAL HYSTERECTOMY     CHOLECYSTECTOMY     FRACTURE SURGERY     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   Chronic migraine 04/14/2020   Left cervical radiculopathy 04/30/2019   Left-sided low back pain with sciatica 02/04/2019   Left elbow pain 01/16/2017   Cervical spondylosis without myelopathy 02/10/2016   Metatarsalgia of both feet 02/02/2015   Gastroesophageal reflux disease without esophagitis 12/18/2014   H/O Helicobacter infection 12/18/2014   History of gastric ulcer 12/18/2014   Essential hypertension 03/04/2014    ONSET DATE: 04/02/2024 date of referral  REFERRING DIAG: R49.0 (ICD-10-CM) - Dysphonia   THERAPY DIAG:  No diagnosis found.  Rationale for Evaluation and Treatment Rehabilitation  SUBJECTIVE:   PERTINENT HISTORY: Pt is a 63 year old female who was referred by her ENT for chief compliant of hoarseness reported as loss of voice  and raspy and moderate-severe in severity. She has associated dry mouth, throat clearing, and throat pain (mild). The hoarseness has been present for 3 months. The hoarseness occurs all the time. The hoarseness developed gradually (months) and has been worsening.   Laryngoscopy on 04/02/2024 revealed mild bowing and mild edema  A chart review reveals documentation of voice hoarseness and frequent throat clearing in the following notes dated:  02/27/2023 06/21/2023 12/14/2023  Patient has persistent dry cough and throat clearing, likely due to post nasal drip +/- GERD.   Voice hoarseness - Patient has dry cough along with voice hoarseness. Most likely cause is from PND symptoms and cough. Reflux could also be contributor. If not better with allergy medication consider trial off DPI and/or referral to ENT.  Gastroesophageal reflux disease without esophagitis - Hx H Pylori and gastric ulcer. No overt reflux symptoms except for cough/voice hoarseness. Pt is compliant with Prilosec 40mg  daily and GERD diet   02/21/2024 d/t continued use of Breztri , oral thrush present - pt noncompliant with rinsing after treatment She experiences persistent hoarseness, which she attributes to a recent flu, and finds it difficult to rest her voice.   03/15/2024 and 03/29/2024   PAIN:  Are you having pain? No   FALLS: Has patient fallen in last 6 months? No,   LIVING ENVIRONMENT: Lives with: lives with an adult companion Lives in: House/apartment  PLOF: Independent  PATIENT GOALS  to assess and improve vocal quality  SUBJECTIVE STATEMENT: Pt arrived late to session after cancelling several sessions and she reported Herminio said I was good to go, he released me. Pt accompanied by: self  OBJECTIVE:   TODAY'S TREATMENT:  Skilled treatment session targeted pt's dysphonia goals. SLP facilitated session by providing the following interventions:  Pt presents with continued improving vocal  quality. She reports having a follow up ENT appt with Dr Herminio releasing her form his care d/t progress. She also reports improvement in singing and is emotionally excited about progress. 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 We reviewed need to continue vocal warm-ups and healthy habits to continue.    VOICE HANDICAP INDEX (VHI)  The Voice Handicap Index is comprised of a series of questions to assess the patient's perception of their voice. It is designed to evaluate the emotional, physical and functional components of the voice problem.  Functional: 0 Physical: 1 Emotional: 0 Total: 1 (Normal mean 8.75, SD =14.97)  z score =  0 no significant impact = 0-1.00  This is much improved from time of evaluation on 04/17/2024 (5.7 severe) and 05/01/2024 (2.4 moderate).   PATIENT EDUCATION: Education details: see the above Person educated: Patient Education method: Explanation Education comprehension: needs further education   HOME EXERCISE PROGRAM:  Practice aggressive vocal rest  Throat clearing suppression strategies  Abdominal breathing exercises Began singing     GOALS: Goals reviewed with patient? Yes  SHORT TERM GOALS: Target date: 10 sessions  The patient will eliminate phonotraumatic behaviors such as chronic throat clearing, by substituting non-traumatic methods to clear mucus.  Baseline: Goal status: INITIAL: MET  2.  Pt will discriminate healthy use from misuse of the voice in various situations with 80% accuracy across 3 data collections.  Baseline:  Goal status: INITIAL: MET  3.  Pt will use abdominal breathing when producing sentences to support phonation in 80% of trials across 3 data sessions.  Baseline:  Goal status: INITIAL: MET   LONG TERM GOALS: Target date: 06/06/2024  The patient will demonstrate independent understanding of vocal hygiene concepts.  Baseline:  Goal status: INITIAL: MET  2.  The patient will be  independent for abdominal breathing and breath support exercises.  Baseline:  Goal status: INITIAL: MET  3.  Patient will report improved communication effectiveness as measured by PROM Baseline:  Goal status: INITIAL: MET see above   ASSESSMENT:  CLINICAL IMPRESSION: Patient is a 63 y.o. 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 In addition, pt with habitual throat clear and intermittent voluntary whispering.   Pt has been eager and made great progress over the course of skilled ST services. As a result she has met her STG and LTGs and currently doesn't present with any dysphonia. All education has been completed.    PLAN: Pt is appropriate for discharge from skilled ST services.   Jannat Rosemeyer B. Rubbie, M.S., CCC-SLP, Tree surgeon Certified Brain Injury Specialist [facility]  [facility] Office [phone] Ascom [phone] Fax [phone]

## 2024-06-10 ENCOUNTER — Ambulatory Visit: Admitting: Speech Pathology

## 2024-06-13 ENCOUNTER — Ambulatory Visit: Admitting: Speech Pathology

## 2024-06-17 ENCOUNTER — Ambulatory Visit: Admitting: Speech Pathology

## 2024-06-20 ENCOUNTER — Ambulatory Visit: Admitting: Speech Pathology

## 2024-06-24 ENCOUNTER — Other Ambulatory Visit: Payer: Self-pay

## 2024-06-24 ENCOUNTER — Encounter: Payer: Self-pay | Admitting: Pulmonary Disease

## 2024-06-24 ENCOUNTER — Ambulatory Visit: Admitting: Speech Pathology

## 2024-06-24 ENCOUNTER — Ambulatory Visit (INDEPENDENT_AMBULATORY_CARE_PROVIDER_SITE_OTHER): Admitting: Pulmonary Disease

## 2024-06-24 VITALS — BP 118/84 | HR 59 | Temp 97.5°F | Ht 62.0 in | Wt 212.0 lb

## 2024-06-24 DIAGNOSIS — G4733 Obstructive sleep apnea (adult) (pediatric): Secondary | ICD-10-CM

## 2024-06-24 DIAGNOSIS — K219 Gastro-esophageal reflux disease without esophagitis: Secondary | ICD-10-CM

## 2024-06-24 DIAGNOSIS — J4540 Moderate persistent asthma, uncomplicated: Secondary | ICD-10-CM

## 2024-06-24 MED ORDER — ALBUTEROL SULFATE HFA 108 (90 BASE) MCG/ACT IN AERS: 2.0000 | INHALATION_SPRAY | Freq: Four times a day (QID) | RESPIRATORY_TRACT | 2 refills | Status: DC | PRN

## 2024-06-24 NOTE — Patient Instructions (Signed)
 VISIT SUMMARY:  Today, we discussed your issues with CPAP compliance due to recent oral surgery, your well-controlled asthma, and ongoing symptoms of gastroesophageal reflux disease (GERD).  YOUR PLAN:  -OBSTRUCTIVE SLEEP APNEA WITH CPAP INTOLERANCE: Obstructive sleep apnea is a condition where your airway becomes blocked during sleep, causing breathing interruptions. Your recent dental procedures have made it difficult to use your CPAP machine effectively due to air leakage around the nasal mask. You will be referred to a sleep specialist, Dr. Jess, for further troubleshooting and management of your CPAP machine.  -ASTHMA: Asthma is a condition where your airways narrow and swell, making it difficult to breathe. Your asthma appears to be well-controlled with your current inhaler regimen, so no changes are needed at this time.  -GASTROESOPHAGEAL REFLUX DISEASE (GERD): GERD is a condition where stomach acid frequently flows back into the tube connecting your mouth and stomach, causing discomfort. You are experiencing persistent symptoms despite taking omeprazole. We will start you on over-the-counter Pepcid AC, one tablet at bedtime, and refer you to a gastroenterologist for further evaluation and management.  INSTRUCTIONS:  Please follow up with Dr. Jess, the sleep specialist, for CPAP troubleshooting and management. Additionally, schedule an appointment with a gastroenterologist for further evaluation of your GERD symptoms. Continue taking your asthma inhalers as prescribed and start taking one Pepcid AC tablet at bedtime for your GERD symptoms.

## 2024-06-24 NOTE — Progress Notes (Signed)
 Subjective:    Patient ID: Valerie Hopkins, female    DOB: 1960/11/26, 63 y.o.   MRN: [id number]  Patient Care Team: Rudy Alyce RAMAN, MD as PCP - General (Family Medicine) Tamea Dedra CROME, MD as Consulting Physician (Pulmonary Disease)  Chief Complaint  Patient presents with   Follow-up    No SOB or wheezing. Little dry cough.  CPAP- Mask leaks and pressure is too high. Had oral surgery and could not wear the CPAP.     BACKGROUND/INTERVAL:Patient is a 63 year old lifelong never smoker who follows for the issue of shortness of breath cough and wheezing in the setting of moderate persistent asthma.  She has also gastroesophageal reflux and recently diagnosed mild sleep apnea with significant symptom burden.  Patient's last visit at the clinic was on 09 May 2024.   HPI Discussed the use of AI scribe software for clinical note transcription with the patient, who gave verbal consent to proceed.  History of Present Illness   Valerie Hopkins is a 63 year old female with asthma and obstructive sleep apnea who presents with issues related to CPAP compliance and recent oral surgery complications.  She has been experiencing difficulty with CPAP compliance due to recent oral surgery complications. Two weeks ago, she underwent a tooth extraction, followed by additional oral surgery last Friday because the initial procedure was incomplete. This has caused discomfort and has prevented effective use of her CPAP machine. She mentions air leakage from her nasal mask.  She experiences disrupted sleep, often waking up with a sensation of something going down her nose or coughing as if she is 'spitting up'. She sometimes questions if she still snores, indicating ongoing sleep disturbances.  She states that she feels that sometimes when she is awakened in the middle of the night is because she is letting out of a loud snorer.  She is currently using inhalers for her asthma and reports doing well with them. She  has completed speech therapy and no longer experiences hoarseness.  She was having some issues with vocal cord dysfunction.  She reports symptoms of indigestion at night, feeling as though she might get sick. She is taking omeprazole but is unsure of the dosage. Protonix  did not alleviate her symptoms, and she continues to experience a sensation of something coming up in her chest.  She has not seen GI.    We reviewed her CPAP compliance which has been very poor given her issues with adapting to the mask and recent oral surgery issues.  She will need to be evaluated by sleep medicine to troubleshoot these issues.   Review of Systems A 10 point review of systems was performed and it is as noted above otherwise negative.   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   Chronic migraine 04/14/2020   Left cervical radiculopathy 04/30/2019   Left-sided low back pain with sciatica 02/04/2019   Left elbow pain 01/16/2017   Cervical spondylosis without myelopathy 02/10/2016   Metatarsalgia of both feet 02/02/2015   Gastroesophageal reflux disease without esophagitis 12/18/2014   H/O Helicobacter infection 12/18/2014   History of gastric ulcer 12/18/2014   Essential hypertension 03/04/2014    Social History   Tobacco Use   Smoking status: Never   Smokeless tobacco: Never  Substance Use Topics   Alcohol use: Never    Allergies  Allergen Reactions   Naproxen Other (See Comments)   Ibuprofen Nausea Only    Other reaction(s): Other (See Comments) GI issues   Ciprofloxacin Swelling   Other Itching   Phentermine Other (See Comments)    Chest tightness   Codeine Nausea And Vomiting    Other reaction(s): Vomiting   Lisinopril Cough    Oxycodone -Acetaminophen  Itching    Current Meds  Medication Sig   acetaminophen  (TYLENOL ) 500 MG tablet Take by mouth. Take 500 mg by mouth every 6 (six) hours as needed for Pain   albuterol  (PROVENTIL ) (2.5 MG/3ML) 0.083% nebulizer solution Take 3 mLs (2.5 mg total) by nebulization every 6 (six) hours as needed for wheezing or shortness of breath.   albuterol  (VENTOLIN  HFA) 108 (90 Base) MCG/ACT inhaler Inhale 2 puffs into the lungs every 6 (six) hours as needed.   atorvastatin (LIPITOR) 10 MG tablet Take 10 mg by mouth daily.   Budeson-Glycopyrrol-Formoterol (BREZTRI  AEROSPHERE) 160-9-4.8 MCG/ACT AERO Inhale 2 puffs into the lungs in the morning and at bedtime.   cholecalciferol (VITAMIN D3) 25 MCG (1000 UNIT) tablet Take 1,000 Units by mouth daily.   fluticasone  (FLONASE ) 50 MCG/ACT nasal spray Place 1 spray into both nostrils daily.   Folic Acid-Vit B6-Vit B12 (FOLBEE) 2.5-25-1 MG TABS tablet Take 1 tablet by mouth daily.   furosemide (LASIX) 20 MG tablet Take 20 mg by mouth daily as needed.   gabapentin  (NEURONTIN ) 600 MG tablet Take 600 mg by mouth daily.   losartan-hydrochlorothiazide (HYZAAR) 100-25 MG tablet Take 1 tablet by mouth every morning.   methocarbamol  (ROBAXIN ) 750 MG tablet Take 750 mg by mouth 2 (two) times daily as needed.   montelukast  (SINGULAIR ) 10 MG tablet Take 1 tablet (10 mg total) by mouth at bedtime.   Multiple Vitamin (MULTIVITAMIN ADULT PO) Take by mouth.   mupirocin  ointment (BACTROBAN ) 2 % Apply two times a day for 7 days.   naproxen (NAPROSYN) 500 MG tablet Take 500 mg by mouth 2 (two) times daily.   SUMAtriptan (IMITREX) 25 MG tablet Take 25 mg by mouth as needed.   topiramate (TOPAMAX) 50 MG tablet Take by mouth. Take 3 tablets (150 mg total) by mouth at bedtime Stop rx for 200mg    triamcinolone  cream (KENALOG ) 0.1 % Apply 1 application topically 2 (two) times daily.    Immunization History  Administered Date(s) Administered   Dtap, Unspecified  06/28/1967, 07/26/1967, 10/11/1967, 02/20/1975   Fluad Quad(high Dose 65+) 07/22/2022   Influenza, Quadrivalent, Recombinant, Inj, Pf 09/01/2022   Influenza, Seasonal, Injecte, Preservative Fre 06/24/2023   Influenza,inj,Quad PF,6+ Mos 08/19/2016, 09/26/2017, 08/18/2021   Influenza-Unspecified 08/08/2013, 08/08/2013, 08/11/2014, 08/11/2014, 08/10/2015, 08/10/2015, 06/25/2019, 06/25/2019, 08/15/2020, 07/23/2023   MMR 08/26/2004, 03/02/2011   Moderna Sars-Covid-2 Vaccination 01/17/2020, 02/14/2020, 09/22/2020, 04/29/2021   PNEUMOCOCCAL CONJUGATE-20 06/24/2023   PPD Test 01/01/2018   Pfizer Covid-19 Vaccine Bivalent Booster 37yrs & up 01/18/2022   Polio, Unspecified 06/28/1967, 10/11/1967, 02/20/1975   Respiratory Syncytial Virus Vaccine,Recomb Aduvanted(Arexvy) 07/22/2022, 09/01/2022   Td (Adult),unspecified 06/25/2007, 02/15/2022   Tdap 11/21/2010, 01/01/2018, 02/15/2022   Zoster Recombinant(Shingrix) 04/14/2020, 11/23/2020        Objective:     BP 118/84 (BP Location: Right Arm, Cuff Size: Large)   Pulse (!) 59   Temp (!) 97.5 F (36.4 C)   Ht 5' 2 (1.575 m)   Wt 212 lb (96.2 kg)   SpO2 98%   BMI 38.78 kg/m  SpO2: 98 % O2 Device: None (Room air)  GENERAL: Overweight woman, ambulatory with assistance of a cane. No conversational dyspnea.  No acute distress. HEAD: Normocephalic, atraumatic.  EYES: Pupils equal, round, reactive to light.  No scleral icterus.  MOUTH: Oral mucosa moist.  No thrush noted. NECK: Supple. No thyromegaly. Trachea midline. No JVD.  No adenopathy. PULMONARY: Good air entry bilaterally.  No adventitious sounds. CARDIOVASCULAR: S1 and S2. Regular rate and rhythm.  No rubs, murmurs or gallops heard.   ABDOMEN: Benign. MUSCULOSKELETAL: No joint deformity, no clubbing, trace edema left lower extremity compared to right. NEUROLOGIC: Grossly nonfocal.  Gait not tested. SKIN: Intact,warm,dry.  Venous stasis changes on the left lower extremity PSYCH: Mood  and behavior normal.  Assessment & Plan:     ICD-10-CM   1. Moderate persistent asthma without complication  J45.40     2. Gastroesophageal reflux disease, unspecified whether esophagitis present  K21.9 Ambulatory referral to Gastroenterology    3. OSA (obstructive sleep apnea)  G47.33      Orders Placed This Encounter  Procedures   Ambulatory referral to Gastroenterology    Referral Priority:   Routine    Referral Type:   Consultation    Referral Reason:   Specialty Services Required    Requested Specialty:   Gastroenterology    Number of Visits Requested:   1   Discussion:    Obstructive sleep apnea with CPAP intolerance Obstructive sleep apnea with difficulty using CPAP due to air leakage around the nasal mask. Recent dental procedures have exacerbated CPAP intolerance. Reports waking up at night with sensations of inability to get breath and, possibly related to sleep apnea. - Refer to sleep specialist, Dr. Jess, for CPAP troubleshooting and management  Asthma Asthma appears to be well-controlled with current inhaler regimen.  Gastroesophageal reflux disease Persistent symptoms of gastroesophageal reflux disease despite current omeprazole therapy. Symptoms include nighttime indigestion and sensation of reflux. Previous trial of Protonix  was ineffective. - Initiate over-the-counter Pepcid AC, one tablet at bedtime - Refer to gastroenterologist for further evaluation and management      Advised if symptoms do not improve or worsen, to please contact office for sooner follow up or seek emergency care.    I spent 32 minutes of dedicated to the care of this patient on the date of this encounter to include pre-visit review of records, face-to-face time with the patient discussing conditions above, post visit ordering of testing, clinical documentation with the electronic health record, making appropriate referrals as documented, and communicating necessary findings to members of  the patients care team.     C. Leita Sanders, MD Advanced Bronchoscopy PCCM  Pulmonary-    *This note was generated using voice recognition software/Dragon and/or AI transcription program.  Despite best efforts to proofread, errors can occur which can change the meaning. Any transcriptional errors that result from this process are unintentional and may not be fully corrected at the time of dictation.

## 2024-07-01 ENCOUNTER — Ambulatory Visit: Admitting: Sleep Medicine

## 2024-07-02 ENCOUNTER — Encounter: Payer: Self-pay | Admitting: Sleep Medicine

## 2024-07-02 ENCOUNTER — Ambulatory Visit: Admitting: Sleep Medicine

## 2024-07-02 VITALS — BP 120/80 | HR 66 | Temp 98.2°F | Ht 62.0 in | Wt 208.6 lb

## 2024-07-02 DIAGNOSIS — Z6838 Body mass index (BMI) 38.0-38.9, adult: Secondary | ICD-10-CM

## 2024-07-02 DIAGNOSIS — I10 Essential (primary) hypertension: Secondary | ICD-10-CM

## 2024-07-02 DIAGNOSIS — E669 Obesity, unspecified: Secondary | ICD-10-CM

## 2024-07-02 DIAGNOSIS — J4540 Moderate persistent asthma, uncomplicated: Secondary | ICD-10-CM

## 2024-07-02 DIAGNOSIS — G4733 Obstructive sleep apnea (adult) (pediatric): Secondary | ICD-10-CM | POA: Diagnosis not present

## 2024-07-02 NOTE — Patient Instructions (Signed)

## 2024-07-02 NOTE — Progress Notes (Signed)
 Name:Valerie Hopkins MRN: [id number] DOB: [birth year]   CHIEF COMPLAINT:  ESTABLISH CARE FOR OSA   HISTORY OF PRESENT ILLNESS:  Valerie Hopkins is a 63 y.o. w/ a h/o OSA, HTN, asthma and obesity who presents to establish care for OSA. Reports difficulty using CPAP therapy the entire night due to pressure and mask discomfort. She is currently using the Airtouch N30i nasal mask, which causes significant air leaks. Reports excessive daytime sleepiness.  Bedtime 11 pm- 1 am Sleep onset 1-2 hours Rise time 5 am   EPWORTH SLEEP SCORE 17    07/02/2024    1:00 PM 12/14/2023   11:00 AM  Results of the Epworth flowsheet  Sitting and reading 3 3  Watching TV 1 2  Sitting, inactive in a public place (e.g. a theatre or a meeting) 2 2  As a passenger in a car for an hour without a break 3 3  Lying down to rest in the afternoon when circumstances permit 2 2  Sitting and talking to someone 1 1  Sitting quietly after a lunch without alcohol 3 3  In a car, while stopped for a few minutes in traffic 2 2  Total score 17 18     PAST MEDICAL HISTORY :   has a past medical history of Hypertension and Migraine.  has a past surgical history that includes Fracture surgery; Cholecystectomy; and Abdominal hysterectomy. Prior to Admission medications   Medication Sig Start Date End Date Taking? Authorizing Provider  acetaminophen  (TYLENOL ) 500 MG tablet Take by mouth. Take 500 mg by mouth every 6 (six) hours as needed for Pain   Yes [provider]  albuterol  (PROVENTIL ) (2.5 MG/3ML) 0.083% nebulizer solution Take 3 mLs (2.5 mg total) by nebulization every 6 (six) hours as needed for wheezing or shortness of breath. 12/29/23  Yes Tamea Dedra CROME, MD  albuterol  (VENTOLIN  HFA) 108 (90 Base) MCG/ACT inhaler Inhale 2 puffs into the lungs every 6 (six) hours as needed. 06/24/24  Yes Tamea Dedra CROME, MD  atorvastatin (LIPITOR) 10 MG tablet Take 10 mg by mouth daily. 01/02/24  Yes [provider]  Budeson-Glycopyrrol-Formoterol (BREZTRI  AEROSPHERE) 160-9-4.8 MCG/ACT AERO Inhale 2 puffs into the lungs in the morning and at bedtime. 12/14/23  Yes Tamea Dedra CROME, MD  cholecalciferol (VITAMIN D3) 25 MCG (1000 UNIT) tablet Take 1,000 Units by mouth daily.   Yes [provider]  fluticasone  (FLONASE ) 50 MCG/ACT nasal spray Place 1 spray into both nostrils daily. 02/27/23  Yes Hope Almarie ORN, NP  Folic Acid-Vit B6-Vit B12 (FOLBEE) 2.5-25-1 MG TABS tablet Take 1 tablet by mouth daily.   Yes [provider]  furosemide (LASIX) 20 MG tablet Take 20 mg by mouth daily as needed.   Yes [provider]  gabapentin  (NEURONTIN ) 600 MG tablet Take 600 mg by mouth daily. 06/19/23  Yes [provider]  losartan-hydrochlorothiazide (HYZAAR) 100-12.5 MG tablet Take 1 tablet by mouth daily. 07/02/24  Yes [provider]  methocarbamol  (ROBAXIN ) 750 MG tablet Take 750 mg by mouth 2 (two) times daily as needed. 11/05/22  Yes [provider]  montelukast  (SINGULAIR ) 10 MG tablet Take 1 tablet (10 mg total) by mouth at bedtime. 02/27/23  Yes Hope Almarie ORN, NP  Multiple Vitamin (MULTIVITAMIN ADULT PO) Take by mouth.   Yes [provider]  mupirocin  ointment (BACTROBAN ) 2 % Apply two times a day for 7 days. 09/21/18  Yes Cleotilde Jacobsen, NP  naproxen (NAPROSYN) 500  MG tablet Take 500 mg by mouth 2 (two) times daily. 06/20/23  Yes [provider]  SUMAtriptan (IMITREX) 25 MG tablet Take 25 mg by mouth as needed. 06/20/23  Yes [provider]  topiramate (TOPAMAX) 50 MG tablet Take by mouth. Take 3 tablets (150 mg total) by mouth at bedtime Stop rx for 200mg  09/29/22 07/02/24 Yes [provider]  triamcinolone  cream (KENALOG ) 0.1 % Apply 1 application topically 2 (two) times daily. 09/21/18  Yes Cleotilde Jacobsen, NP  butalbital-acetaminophen -caffeine (FIORICET, ESGIC) 50-325-40 MG tablet Take by mouth as needed for  migraine. Patient not taking: Reported on 07/02/2024 08/01/16   [provider]  CORLANOR 5 MG TABS tablet Take 5 mg by mouth daily. Patient not taking: Reported on 07/02/2024 10/05/22   [provider]  hydrOXYzine (ATARAX) 25 MG tablet Take 25 mg by mouth 3 (three) times daily as needed. Patient not taking: Reported on 07/02/2024 08/29/22   [provider]  isosorbide mononitrate (IMDUR) 30 MG 24 hr tablet Take 30 mg by mouth daily. Patient not taking: Reported on 07/02/2024 12/12/23 12/11/24  [provider]  metoprolol  tartrate (LOPRESSOR ) 100 MG tablet Take 1 tablet (100 mg total) by mouth once for 1 dose. Please take one time dose 100mg  metoprolol  tartrate 2 hr prior to cardiac CT for HR control IF HR >55bpm. Patient not taking: Reported on 07/02/2024 12/15/23 12/15/23  Darliss Rogue, MD  ondansetron  (ZOFRAN ) 4 MG tablet Take 4 mg by mouth every 8 (eight) hours as needed. Patient not taking: Reported on 07/02/2024 03/08/23   [provider]  pantoprazole  (PROTONIX ) 40 MG tablet Take 1 tablet (40 mg total) by mouth 2 (two) times daily before a meal. Patient not taking: Reported on 07/02/2024 12/14/23   Tamea Dedra CROME, MD   Allergies  Allergen Reactions   Naproxen Other (See Comments)   Ibuprofen Nausea Only    Other reaction(s): Other (See Comments) GI issues   Ciprofloxacin Swelling   Other Itching   Phentermine Other (See Comments)    Chest tightness   Codeine Nausea And Vomiting    Other reaction(s): Vomiting   Lisinopril Cough   Oxycodone -Acetaminophen  Itching    FAMILY HISTORY:  family history includes Heart disease in her father; Hypertension in her mother; Kidney failure in her mother. SOCIAL HISTORY:  reports that she has never smoked. She has never used smokeless tobacco. She reports that she does not drink alcohol and does not use drugs.   Review of Systems:  Gen:  Denies  fever, sweats, chills weight loss  HEENT: Denies  blurred vision, double vision, ear pain, eye pain, hearing loss, nose bleeds, sore throat Cardiac:  No dizziness, chest pain or heaviness, chest tightness,edema, No JVD Resp:   No cough, -sputum production, -shortness of breath,-wheezing, -hemoptysis,  Gi: Denies swallowing difficulty, stomach pain, nausea or vomiting, diarrhea, constipation, bowel incontinence Gu:  Denies bladder incontinence, burning urine Ext:   Denies Joint pain, stiffness or swelling Skin: Denies  skin rash, easy bruising or bleeding or hives Endoc:  Denies polyuria, polydipsia , polyphagia or weight change Psych:   Denies depression, insomnia or hallucinations  Other:  All other systems negative  VITAL SIGNS: BP 120/80 (BP Location: Right Arm, Patient Position: Sitting, Cuff Size: Large)   Pulse 66   Temp 98.2 F (36.8 C) (Oral)   Ht 5' 2 (1.575 m)   Wt 208 lb 9.6 oz (94.6 kg)   SpO2 98%   BMI 38.15 kg/m  Physical Examination:   General Appearance: No distress  EYES PERRLA, EOM intact.   NECK Supple, No JVD Pulmonary: normal breath sounds, No wheezing.  CardiovascularNormal S1,S2.  No m/r/g.   Abdomen: Benign, Soft, non-tender. Skin:   warm, no rashes, no ecchymosis  Extremities: normal, no cyanosis, clubbing. Neuro:without focal findings,  speech normal  PSYCHIATRIC: Mood, affect within normal limits.   ASSESSMENT AND PLAN  OSA Counseled patient on the importance of using CPAP therapy. To improve comfort and compliance, will change CPAP pressure range to 4-8 cm H2O. Also counseled patient on proper mask fit. Discussed the consequences of untreated sleep apnea. Advised not to drive drowsy for safety of patient and others. Will follow up in 3 months.    Asthma Stable, on current management, following with Dr. Tamea.  HTN Stable, on current management. Following with PCP.   Obesity Counseled patient on diet and lifestyle modification.   Patient  satisfied with Plan of action and  management. All questions answered  I spent a total of 39 minutes reviewing chart data, face-to-face evaluation with the patient, counseling and coordination of care as detailed above.    Rawn Quiroa, M.D.  Sleep Medicine [facility]

## 2024-07-05 ENCOUNTER — Telehealth: Payer: Self-pay | Admitting: Pulmonary Disease

## 2024-07-05 NOTE — Telephone Encounter (Signed)
 [facility] and [facility] will not see the patient because she is established with [facility]. Valerie Hopkins is currently scheduled to see Mliss Griffes 12/20/2024 but wants to be seen sooner then that. I called [facility] asking if she is already established what can she do to be seen sooner. I spoke with Metta and she stated to send a Mychart message to the provider letting them know what the issues are to see if they can squeeze her into their scheduled sooner then 12/20/2024. I then called Valerie. Begue back and let her know what Metta suggested. She is going to send message to Dr. Griffes. Valerie. Drohan doesn't want to see Dr. Toribio Clas because of previous issues

## 2024-07-08 ENCOUNTER — Ambulatory Visit: Admitting: Sleep Medicine

## 2024-07-13 ENCOUNTER — Other Ambulatory Visit: Payer: Self-pay | Admitting: Pulmonary Disease

## 2024-09-30 ENCOUNTER — Encounter: Payer: Self-pay | Admitting: Sleep Medicine

## 2024-09-30 ENCOUNTER — Encounter: Payer: Self-pay | Admitting: Pulmonary Disease

## 2024-09-30 ENCOUNTER — Ambulatory Visit: Admitting: Pulmonary Disease

## 2024-09-30 ENCOUNTER — Ambulatory Visit (INDEPENDENT_AMBULATORY_CARE_PROVIDER_SITE_OTHER): Admitting: Sleep Medicine

## 2024-09-30 VITALS — BP 124/80 | HR 82 | Temp 97.9°F | Ht 62.0 in | Wt 211.9 lb

## 2024-09-30 VITALS — BP 124/80 | HR 82 | Temp 97.9°F | Ht 62.0 in | Wt 211.6 lb

## 2024-09-30 DIAGNOSIS — G4733 Obstructive sleep apnea (adult) (pediatric): Secondary | ICD-10-CM

## 2024-09-30 DIAGNOSIS — K219 Gastro-esophageal reflux disease without esophagitis: Secondary | ICD-10-CM | POA: Diagnosis not present

## 2024-09-30 DIAGNOSIS — J4540 Moderate persistent asthma, uncomplicated: Secondary | ICD-10-CM

## 2024-09-30 DIAGNOSIS — E669 Obesity, unspecified: Secondary | ICD-10-CM

## 2024-09-30 DIAGNOSIS — I10 Essential (primary) hypertension: Secondary | ICD-10-CM | POA: Diagnosis not present

## 2024-09-30 NOTE — Addendum Note (Signed)
 Addended by: Joshau Code on: 09/30/2024 01:20 PM   Modules accepted: Level of Service

## 2024-09-30 NOTE — Patient Instructions (Signed)
 VISIT SUMMARY:  Today, we discussed the management of your asthma and gastroesophageal reflux disease (GERD). Your asthma is well-controlled with your current inhalers, and your GERD medication is working effectively.  YOUR PLAN:  -ASTHMA: Asthma is a condition where your airways narrow and swell, making it difficult to breathe. Your asthma is well-controlled with your current inhalers, Breztri  and albuterol . Continue using Breztri  as prescribed and albuterol  as needed. We will follow up in six months to monitor your condition.  -GASTROESOPHAGEAL REFLUX DISEASE (GERD): GERD is a condition where stomach acid frequently flows back into the tube connecting your mouth and stomach, causing irritation. Your GERD is being managed effectively with the medication prescribed by your gastroenterologist. Continue taking your GERD medication as directed.  INSTRUCTIONS:  Please continue using your Breztri  and albuterol  inhalers as directed. Keep taking your GERD medication as prescribed by your gastroenterologist. We will schedule a follow-up appointment in six months to monitor your asthma. If you experience any new or worsening symptoms, please contact our office.

## 2024-09-30 NOTE — Progress Notes (Signed)
 Subjective:    Patient ID: Valerie Hopkins, female    DOB: September 15, 1961, 63 y.o.   MRN: [id number]  Patient Care Team: Rudy Alyce RAMAN, MD as PCP - General (Family Medicine) Tamea Dedra CROME, MD as Consulting Physician (Pulmonary Disease)  Chief Complaint  Patient presents with   Asthma    BACKGROUND/INTERVAL:Patient is a 63 year old lifelong never smoker who follows for the issue of shortness of breath cough and wheezing in the setting of moderate persistent asthma.  She has also gastroesophageal reflux and recently diagnosed mild sleep apnea with significant symptom burden.  Patient's last visit at the clinic was on 24 June 2024.   HPI Discussed the use of AI scribe software for clinical note transcription with the patient, who gave verbal consent to proceed.  History of Present Illness   Valerie Hopkins is a 63 year old female with asthma who presents for management of her condition.  She has seen a gastroenterologist for her gastroesophageal reflux disease (GERD). She was prescribed medication that she feels is working better for her reflux (Prilosec 40 mg).  She continues to use Breztri  as her inhaler and finds it effective. She uses her rescue inhaler, albuterol , two puffs in the morning and two puffs in the evening. She needs the rescue inhaler regularly and has not tried to reduce its use.  She experiences chest tightness relieved by the albuterol . Her allergies, particularly related to sinus drainage, are problematic, especially with the current season of falling leaves.  She has had recent blood tests, including allergy testing, which did not show any significant allergies. She has received her flu shot earlier this year.     She has been totally noncompliant with CPAP.  She had a visit with sleep medicine today.  Review of Systems A 10 point review of systems was performed and it is as noted above otherwise negative.   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   Chronic migraine 04/14/2020   Left cervical radiculopathy 04/30/2019   Left-sided low back pain with sciatica 02/04/2019   Left elbow pain 01/16/2017   Cervical spondylosis without myelopathy 02/10/2016   Metatarsalgia of both feet 02/02/2015   Gastroesophageal reflux disease without esophagitis 12/18/2014   H/O Helicobacter infection 12/18/2014   History of gastric ulcer 12/18/2014   Essential hypertension 03/04/2014    Social History   Tobacco Use   Smoking status: Never   Smokeless tobacco: Never  Substance Use Topics   Alcohol use: Never    Allergies  Allergen Reactions   Naproxen Other (See Comments)   Ibuprofen Nausea Only    Other reaction(s): Other (See Comments) GI issues   Ciprofloxacin Swelling   Other Itching   Phentermine Other (See Comments)    Chest tightness   Codeine Nausea And Vomiting    Other reaction(s): Vomiting   Lisinopril Cough   Oxycodone -Acetaminophen  Itching    Current Meds  Medication Sig   acetaminophen  (TYLENOL ) 500 MG tablet Take by mouth. Take 500 mg by mouth every 6 (six) hours as needed for Pain   albuterol  (PROVENTIL ) (2.5 MG/3ML) 0.083% nebulizer solution Take 3 mLs (2.5 mg total) by nebulization every 6 (six) hours as needed for wheezing or shortness of breath.   albuterol  (VENTOLIN  HFA) 108 (90 Base) MCG/ACT  inhaler INHALE TWO PUFFS BY MOUTH EVERY 6 HOURS AS NEEDED FOR WHEEZING OR FOR SHORTNESS OF BREATH   atorvastatin (LIPITOR) 10 MG tablet Take 10 mg by mouth daily.   Budeson-Glycopyrrol-Formoterol (BREZTRI  AEROSPHERE) 160-9-4.8 MCG/ACT AERO Inhale 2 puffs into the lungs in the morning and at bedtime.   cholecalciferol (VITAMIN D3) 25 MCG (1000 UNIT) tablet Take 1,000 Units by mouth daily.   fluticasone   (FLONASE ) 50 MCG/ACT nasal spray Place 1 spray into both nostrils daily.   Folic Acid-Vit B6-Vit B12 (FOLBEE) 2.5-25-1 MG TABS tablet Take 1 tablet by mouth daily.   furosemide (LASIX) 20 MG tablet Take 20 mg by mouth daily as needed.   gabapentin  (NEURONTIN ) 600 MG tablet Take 600 mg by mouth daily.   HYDROcodone-acetaminophen  (NORCO/VICODIN) 5-325 MG tablet Take 1 tablet by mouth every 6 (six) hours as needed.   losartan-hydrochlorothiazide (HYZAAR) 100-25 MG tablet Take 1 tablet by mouth every morning.   Multiple Vitamin (MULTIVITAMIN ADULT PO) Take by mouth.   mupirocin  ointment (BACTROBAN ) 2 % Apply two times a day for 7 days.   omeprazole (PRILOSEC) 40 MG capsule Take 40 mg by mouth 2 (two) times daily.   ondansetron  (ZOFRAN -ODT) 4 MG disintegrating tablet Take 4 mg by mouth every 8 (eight) hours as needed.   triamcinolone  (KENALOG ) 0.025 % cream Apply 1 Application topically daily as needed.    Immunization History  Administered Date(s) Administered   Dtap, Unspecified 06/28/1967, 07/26/1967, 10/11/1967, 02/20/1975   Fluad Quad(high Dose 65+) 07/22/2022   Influenza, Quadrivalent, Recombinant, Inj, Pf 09/01/2022   Influenza, Seasonal, Injecte, Preservative Fre 06/24/2023   Influenza,inj,Quad PF,6+ Mos 08/19/2016, 09/26/2017, 08/18/2021   Influenza-Unspecified 08/08/2013, 08/08/2013, 08/11/2014, 08/11/2014, 08/10/2015, 08/10/2015, 06/25/2019, 06/25/2019, 08/15/2020, 07/23/2023   MMR 08/26/2004, 03/02/2011   Moderna Sars-Covid-2 Vaccination 01/17/2020, 02/14/2020, 09/22/2020, 04/29/2021   PNEUMOCOCCAL CONJUGATE-20 06/24/2023   PPD Test 01/01/2018   Pfizer Covid-19 Vaccine Bivalent Booster 25yrs & up 01/18/2022   Polio, Unspecified 06/28/1967, 10/11/1967, 02/20/1975   Respiratory Syncytial Virus Vaccine,Recomb Aduvanted(Arexvy) 07/22/2022, 09/01/2022   Td (Adult),unspecified 06/25/2007, 02/15/2022   Tdap 11/21/2010, 01/01/2018, 02/15/2022   Zoster Recombinant(Shingrix) 04/14/2020,  11/23/2020        Objective:     BP 124/80   Pulse 82   Temp 97.9 F (36.6 C) (Temporal)   Ht 5' 2 (1.575 m)   Wt 211 lb 9.6 oz (96 kg)   SpO2 98%   BMI 38.70 kg/m   SpO2: 98 %  GENERAL: Overweight woman, ambulatory with assistance of a cane. No conversational dyspnea.  No acute distress.  Frequent throat clearing. HEAD: Normocephalic, atraumatic.  EYES: Pupils equal, round, reactive to light.  No scleral icterus.  MOUTH: Oral mucosa moist.  No thrush noted. NECK: Supple. No thyromegaly. Trachea midline. No JVD.  No adenopathy. PULMONARY: Good air entry bilaterally.  No adventitious sounds. CARDIOVASCULAR: S1 and S2. Regular rate and rhythm.  No rubs, murmurs or gallops heard.   ABDOMEN: Benign. MUSCULOSKELETAL: No joint deformity, no clubbing, trace edema left lower extremity compared to right. NEUROLOGIC: Grossly nonfocal.  Gait not tested. SKIN: Intact,warm,dry.  Venous stasis changes on the left lower extremity PSYCH: Mood and behavior normal.        Assessment & Plan:     ICD-10-CM   1. Moderate persistent asthma without complication  J45.40     2. Gastroesophageal reflux disease, unspecified whether esophagitis present  K21.9     3. Obesity, unspecified class, unspecified obesity type, unspecified whether serious comorbidity present  E66.9  4. OSA (obstructive sleep apnea)  G47.33      Discussion:    Asthma Well-controlled with Breztri  and albuterol . She uses the rescue inhaler twice daily, indicating a need for regular use. Chest tightness may be related to sinus drainage rather than asthma exacerbation. - Continue Breztri  inhaler as prescribed. - Continue albuterol  inhaler as needed. - Scheduled follow-up in six months.  Gastroesophageal reflux disease (GERD) GERD is managed with medication prescribed by a gastroenterologist, which appears effective. GERD is a significant trigger for asthma symptoms, emphasizing the importance of its control. -  Continue current GERD medication as prescribed by gastroenterologist.   Obstructive sleep apnea with poor CPAP compliance - Patient being evaluated by sleep medicine today.     Advised if symptoms do not improve or worsen, to please contact office for sooner follow up or seek emergency care.    I spent 30 minutes of dedicated to the care of this patient on the date of this encounter to include pre-visit review of records, face-to-face time with the patient discussing conditions above, post visit ordering of testing, clinical documentation with the electronic health record, making appropriate referrals as documented, and communicating necessary findings to members of the patients care team.     C. Leita Sanders, MD Advanced Bronchoscopy PCCM [facility]    *This note was generated using voice recognition software/Dragon and/or AI transcription program.  Despite best efforts to proofread, errors can occur which can change the meaning. Any transcriptional errors that result from this process are unintentional and may not be fully corrected at the time of dictation.

## 2024-09-30 NOTE — Progress Notes (Addendum)
 Name:Valerie Hopkins MRN: [id number] DOB: Apr 01, 1961   CHIEF COMPLAINT:  CPAP F/U    HISTORY OF PRESENT ILLNESS:  [name] is a 63 y.o. w/ a h/o OSA, HTN, asthma and obesity who presents for CPAP F/U visit. Reports difficulty using CPAP therapy due to mask discomfort. Was using the [employer]ouch N30i nasal mask. Reports excessive daytime sleepiness.   EPWORTH SLEEP SCORE     07/02/2024    1:00 PM 12/14/2023   11:00 AM  Results of the Epworth flowsheet  Sitting and reading 3 3  Watching TV 1 2  Sitting, inactive in a public place (e.g. a theatre or a meeting) 2 2  As a passenger in a car for an hour without a break 3 3  Lying down to rest in the afternoon when circumstances permit 2 2  Sitting and talking to someone 1 1  Sitting quietly after a lunch without alcohol 3 3  In a car, while stopped for a few minutes in traffic 2 2  Total score 17 18     PAST MEDICAL HISTORY :   has a past medical history of Hypertension and Migraine.  has a past surgical history that includes Fracture surgery; Cholecystectomy; and Abdominal hysterectomy. Prior to Admission medications   Medication Sig Start Date End Date Taking? Authorizing Provider  acetaminophen  (TYLENOL ) 500 MG tablet Take by mouth. Take 500 mg by mouth every 6 (six) hours as needed for Pain   Yes [provider]  albuterol  (PROVENTIL ) (2.5 MG/3ML) 0.083% nebulizer solution Take 3 mLs (2.5 mg total) by nebulization every 6 (six) hours as needed for wheezing or shortness of breath. 12/29/23  Yes Tamea Dedra CROME, MD  albuterol  (VENTOLIN  HFA) 108 (90 Base) MCG/ACT inhaler Inhale 2 puffs into the lungs every 6 (six) hours as needed. 06/24/24  Yes Tamea Dedra CROME, MD  atorvastatin (LIPITOR) 10 MG tablet Take 10 mg by mouth daily. 01/02/24  Yes [provider]  Budeson-Glycopyrrol-Formoterol (BREZTRI  AEROSPHERE) 160-9-4.8 MCG/ACT AERO Inhale 2 puffs into the lungs in the morning and at bedtime. 12/14/23  Yes  Tamea Dedra CROME, MD  cholecalciferol (VITAMIN D3) 25 MCG (1000 UNIT) tablet Take 1,000 Units by mouth daily.   Yes [provider]  fluticasone  (FLONASE ) 50 MCG/ACT nasal spray Place 1 spray into both nostrils daily. 02/27/23  Yes Hope Almarie ORN, NP  Folic Acid-Vit B6-Vit B12 (FOLBEE) 2.5-25-1 MG TABS tablet Take 1 tablet by mouth daily.   Yes [provider]  furosemide (LASIX) 20 MG tablet Take 20 mg by mouth daily as needed.   Yes [provider]  gabapentin  (NEURONTIN ) 600 MG tablet Take 600 mg by mouth daily. 06/19/23  Yes [provider]  losartan-hydrochlorothiazide (HYZAAR) 100-12.5 MG tablet Take 1 tablet by mouth daily. 07/02/24  Yes [provider]  methocarbamol  (ROBAXIN ) 750 MG tablet Take 750 mg by mouth 2 (two) times daily as needed. 11/05/22  Yes [provider]  montelukast  (SINGULAIR ) 10 MG tablet Take 1 tablet (10 mg total) by mouth at bedtime. 02/27/23  Yes Hope Almarie ORN, NP  Multiple Vitamin (MULTIVITAMIN ADULT PO) Take by mouth.   Yes [provider]  mupirocin  ointment (BACTROBAN ) 2 % Apply two times a day for 7 days. 09/21/18  Yes Cleotilde Jacobsen, NP  naproxen (NAPROSYN) 500 MG tablet Take 500 mg by mouth 2 (two) times daily. 06/20/23  Yes [provider]  SUMAtriptan (IMITREX) 25 MG tablet Take 25 mg by mouth  as needed. 06/20/23  Yes [provider]  topiramate (TOPAMAX) 50 MG tablet Take by mouth. Take 3 tablets (150 mg total) by mouth at bedtime Stop rx for 200mg  09/29/22 07/02/24 Yes [provider]  triamcinolone  cream (KENALOG ) 0.1 % Apply 1 application topically 2 (two) times daily. 09/21/18  Yes Cleotilde Jacobsen, NP  butalbital-acetaminophen -caffeine (FIORICET, ESGIC) 50-325-40 MG tablet Take by mouth as needed for migraine. Patient not taking: Reported on 07/02/2024 08/01/16   [provider]  CORLANOR 5 MG TABS tablet Take 5 mg by mouth daily. Patient not taking:  Reported on 07/02/2024 10/05/22   [provider]  hydrOXYzine (ATARAX) 25 MG tablet Take 25 mg by mouth 3 (three) times daily as needed. Patient not taking: Reported on 07/02/2024 08/29/22   [provider]  isosorbide mononitrate (IMDUR) 30 MG 24 hr tablet Take 30 mg by mouth daily. Patient not taking: Reported on 07/02/2024 12/12/23 12/11/24  [provider]  metoprolol  tartrate (LOPRESSOR ) 100 MG tablet Take 1 tablet (100 mg total) by mouth once for 1 dose. Please take one time dose 100mg  metoprolol  tartrate 2 hr prior to cardiac CT for HR control IF HR >55bpm. Patient not taking: Reported on 07/02/2024 12/15/23 12/15/23  Darliss Rogue, MD  ondansetron  (ZOFRAN ) 4 MG tablet Take 4 mg by mouth every 8 (eight) hours as needed. Patient not taking: Reported on 07/02/2024 03/08/23   [provider]  pantoprazole  (PROTONIX ) 40 MG tablet Take 1 tablet (40 mg total) by mouth 2 (two) times daily before a meal. Patient not taking: Reported on 07/02/2024 12/14/23   Tamea Dedra CROME, MD   Allergies  Allergen Reactions   Naproxen Other (See Comments)   Ibuprofen Nausea Only    Other reaction(s): Other (See Comments) GI issues   Ciprofloxacin Swelling   Other Itching   Phentermine Other (See Comments)    Chest tightness   Codeine Nausea And Vomiting    Other reaction(s): Vomiting   Lisinopril Cough   Oxycodone -Acetaminophen  Itching    FAMILY HISTORY:  family history includes Heart disease in her father; Hypertension in her mother; Kidney failure in her mother. SOCIAL HISTORY:  reports that she has never smoked. She has never used smokeless tobacco. She reports that she does not drink alcohol and does not use drugs.   Review of Systems:  Gen:  Denies  fever, sweats, chills weight loss  HEENT: Denies blurred vision, double vision, ear pain, eye pain, hearing loss, nose bleeds, sore throat Cardiac:  No dizziness, chest pain or heaviness, chest tightness,edema, No  JVD Resp:   No cough, -sputum production, -shortness of breath,-wheezing, -hemoptysis,  Gi: Denies swallowing difficulty, stomach pain, nausea or vomiting, diarrhea, constipation, bowel incontinence Gu:  Denies bladder incontinence, burning urine Ext:   Denies Joint pain, stiffness or swelling Skin: Denies  skin rash, easy bruising or bleeding or hives Endoc:  Denies polyuria, polydipsia , polyphagia or weight change Psych:   Denies depression, insomnia or hallucinations  Other:  All other systems negative  VITAL SIGNS: BP 124/80   Pulse 82   Temp 97.9 F (36.6 C)   Ht 5' 2 (1.575 m)   Wt 211 lb 14.4 oz (96.1 kg)   SpO2 98%   BMI 38.76 kg/m     Physical Examination:   General Appearance: No distress  EYES PERRLA, EOM intact.   NECK Supple, No JVD Pulmonary: normal breath sounds, No wheezing.  CardiovascularNormal S1,S2.  No m/r/g.   Abdomen: Benign, Soft, non-tender. Skin:  warm, no rashes, no ecchymosis  Extremities: normal, no cyanosis, clubbing. Neuro:without focal findings,  speech normal  PSYCHIATRIC: Mood, affect within normal limits.   ASSESSMENT AND PLAN  OSA Due to difficulty with CPAP therapy, will complete in lab CPAP titration study.  Discussed the consequences of untreated sleep apnea. Advised not to drive drowsy for safety of patient and others. Will follow up to review titration study results.    Asthma Stable, on current management, following with Dr. Tamea.  HTN Stable, on current management. Following with PCP.   Morbid obesity Counseled patient on diet and lifestyle modification.    Patient  satisfied with Plan of action and management. All questions answered  I spent a total of 34 minutes reviewing chart data, face-to-face evaluation with the patient, counseling and coordination of care as detailed above.    Montine Hight, M.D.  [facility]  Pulmonary & Critical Care Medicine

## 2024-10-03 ENCOUNTER — Ambulatory Visit: Admitting: Sleep Medicine

## 2024-10-06 ENCOUNTER — Encounter

## 2024-10-11 ENCOUNTER — Ambulatory Visit: Payer: Self-pay | Admitting: Pulmonary Disease

## 2024-10-11 DIAGNOSIS — G4733 Obstructive sleep apnea (adult) (pediatric): Secondary | ICD-10-CM

## 2024-10-11 NOTE — Telephone Encounter (Signed)
 Per Dr. Chase last office visit note-  OSA Due to difficulty with CPAP therapy, will complete in lab CPAP titration study.  Discussed the consequences of untreated sleep apnea. Advised not to drive drowsy for safety of patient and others. Will follow up to review titration study results.      I have placed the order for the in lab titration study.  Donzell, please sent order ASAP.

## 2024-10-11 NOTE — Addendum Note (Signed)
 Addended by: VICCI EVALENE DEL on: 10/11/2024 12:36 PM   Modules accepted: Orders

## 2024-10-11 NOTE — Telephone Encounter (Signed)
 FYI Only or Action Required?: Action required by provider: referral request. IN LAB SLEEP STUDY order and to call to schedule  Patient is followed in Pulmonology for OSA, last seen on 09/30/2024 by Valerie Devona BIRCH, MD.  Called Nurse Triage reporting Sleep Apnea.  Symptoms began ongoing .  Interventions attempted: Other: na.  Symptoms are: stable.  Triage Disposition: Information or Advice Only Call  Patient/caregiver understands and will follow disposition?: Yes  Copied from CRM #[id number]. Topic: Clinical - Red Word Triage >> Oct 11, 2024 12:00 PM Valerie Hopkins wrote: [name] that prompted transfer to Nurse Triage: [name], blood pressure is high 144/92, was higher this morning. Reason for Disposition  [1] Follow-up call to recent contact AND [2] information only call, no triage required  Answer Assessment - Initial Assessment Questions 1. REASON FOR CALL: What is the main reason for your call? or How can I best help you?     Patient seen by PCP office this morning and prescribed amlodipine. Was advised that she needs to get set up for her in lab sleep study ASAP.   LOV notes with Dr Jess do say in-lab CPAP titration but only orders placed for Home sleep study. Needs new order and please reach out to schedule. Thanks!  Protocols used: Information Only Call - No Triage-[name]

## 2024-12-05 ENCOUNTER — Ambulatory Visit: Attending: Sleep Medicine

## 2024-12-05 DIAGNOSIS — G4733 Obstructive sleep apnea (adult) (pediatric): Secondary | ICD-10-CM | POA: Insufficient documentation

## 2024-12-13 DIAGNOSIS — G4733 Obstructive sleep apnea (adult) (pediatric): Secondary | ICD-10-CM

## 2024-12-18 ENCOUNTER — Ambulatory Visit (INDEPENDENT_AMBULATORY_CARE_PROVIDER_SITE_OTHER): Payer: Self-pay | Admitting: Sleep Medicine

## 2024-12-19 NOTE — Progress Notes (Signed)
Letter generated for pt.
